# Patient Record
Sex: Female | Born: 1940
Health system: Southern US, Community
[De-identification: ages and names within clinical notes are randomized; demographics above are authoritative.]

## PROBLEM LIST (undated history)

## (undated) DIAGNOSIS — J449 Chronic obstructive pulmonary disease, unspecified: Secondary | ICD-10-CM

## (undated) DIAGNOSIS — F4310 Post-traumatic stress disorder, unspecified: Secondary | ICD-10-CM

## (undated) DIAGNOSIS — I10 Essential (primary) hypertension: Secondary | ICD-10-CM

## (undated) DIAGNOSIS — J45909 Unspecified asthma, uncomplicated: Secondary | ICD-10-CM

## (undated) DIAGNOSIS — E119 Type 2 diabetes mellitus without complications: Secondary | ICD-10-CM

## (undated) HISTORY — PX: ABDOMINAL HYSTERECTOMY: SHX81

## (undated) HISTORY — PX: APPENDECTOMY: SHX54

## (undated) HISTORY — PX: TONSILLECTOMY: SUR1361

## (undated) HISTORY — PX: TOTAL HIP ARTHROPLASTY: SHX124

## (undated) HISTORY — DX: Post-traumatic stress disorder, unspecified: F43.10

## (undated) HISTORY — DX: Essential (primary) hypertension: I10

## (undated) HISTORY — PX: BRAIN SURGERY: SHX531

---

## 2015-05-14 LAB — HM COLONOSCOPY

## 2015-11-06 LAB — HM MAMMOGRAPHY: HM Mammogram: NORMAL (ref 0–4)

## 2015-12-28 DIAGNOSIS — R4182 Altered mental status, unspecified: Secondary | ICD-10-CM | POA: Diagnosis not present

## 2015-12-28 DIAGNOSIS — T1491 Suicide attempt: Secondary | ICD-10-CM | POA: Diagnosis not present

## 2015-12-28 DIAGNOSIS — G319 Degenerative disease of nervous system, unspecified: Secondary | ICD-10-CM | POA: Diagnosis not present

## 2015-12-28 DIAGNOSIS — J449 Chronic obstructive pulmonary disease, unspecified: Secondary | ICD-10-CM | POA: Diagnosis not present

## 2015-12-28 DIAGNOSIS — F4323 Adjustment disorder with mixed anxiety and depressed mood: Secondary | ICD-10-CM | POA: Diagnosis not present

## 2015-12-28 DIAGNOSIS — Z87891 Personal history of nicotine dependence: Secondary | ICD-10-CM | POA: Diagnosis not present

## 2015-12-28 DIAGNOSIS — I2510 Atherosclerotic heart disease of native coronary artery without angina pectoris: Secondary | ICD-10-CM | POA: Diagnosis not present

## 2015-12-28 DIAGNOSIS — Z8673 Personal history of transient ischemic attack (TIA), and cerebral infarction without residual deficits: Secondary | ICD-10-CM | POA: Diagnosis not present

## 2015-12-28 DIAGNOSIS — I739 Peripheral vascular disease, unspecified: Secondary | ICD-10-CM | POA: Diagnosis not present

## 2015-12-28 DIAGNOSIS — E785 Hyperlipidemia, unspecified: Secondary | ICD-10-CM | POA: Diagnosis not present

## 2015-12-28 DIAGNOSIS — I10 Essential (primary) hypertension: Secondary | ICD-10-CM | POA: Diagnosis not present

## 2015-12-28 DIAGNOSIS — E119 Type 2 diabetes mellitus without complications: Secondary | ICD-10-CM | POA: Diagnosis not present

## 2015-12-28 DIAGNOSIS — T50904A Poisoning by unspecified drugs, medicaments and biological substances, undetermined, initial encounter: Secondary | ICD-10-CM | POA: Diagnosis not present

## 2015-12-28 DIAGNOSIS — Z780 Asymptomatic menopausal state: Secondary | ICD-10-CM | POA: Diagnosis not present

## 2015-12-28 DIAGNOSIS — T50902A Poisoning by unspecified drugs, medicaments and biological substances, intentional self-harm, initial encounter: Secondary | ICD-10-CM | POA: Diagnosis not present

## 2015-12-28 DIAGNOSIS — G9389 Other specified disorders of brain: Secondary | ICD-10-CM | POA: Diagnosis not present

## 2015-12-28 DIAGNOSIS — T428X2A Poisoning by antiparkinsonism drugs and other central muscle-tone depressants, intentional self-harm, initial encounter: Secondary | ICD-10-CM | POA: Diagnosis not present

## 2015-12-29 DIAGNOSIS — F4323 Adjustment disorder with mixed anxiety and depressed mood: Secondary | ICD-10-CM | POA: Diagnosis not present

## 2016-02-13 DIAGNOSIS — H903 Sensorineural hearing loss, bilateral: Secondary | ICD-10-CM | POA: Diagnosis not present

## 2016-03-03 DIAGNOSIS — H52221 Regular astigmatism, right eye: Secondary | ICD-10-CM | POA: Diagnosis not present

## 2016-03-03 DIAGNOSIS — Z961 Presence of intraocular lens: Secondary | ICD-10-CM | POA: Diagnosis not present

## 2016-03-03 DIAGNOSIS — H02831 Dermatochalasis of right upper eyelid: Secondary | ICD-10-CM | POA: Diagnosis not present

## 2016-03-03 DIAGNOSIS — H02834 Dermatochalasis of left upper eyelid: Secondary | ICD-10-CM | POA: Diagnosis not present

## 2016-03-03 DIAGNOSIS — H353211 Exudative age-related macular degeneration, right eye, with active choroidal neovascularization: Secondary | ICD-10-CM | POA: Diagnosis not present

## 2016-03-03 DIAGNOSIS — H5442 Blindness, left eye, normal vision right eye: Secondary | ICD-10-CM | POA: Diagnosis not present

## 2016-03-03 DIAGNOSIS — H353223 Exudative age-related macular degeneration, left eye, with inactive scar: Secondary | ICD-10-CM | POA: Diagnosis not present

## 2016-03-03 DIAGNOSIS — E1165 Type 2 diabetes mellitus with hyperglycemia: Secondary | ICD-10-CM | POA: Diagnosis not present

## 2016-03-03 DIAGNOSIS — H2512 Age-related nuclear cataract, left eye: Secondary | ICD-10-CM | POA: Diagnosis not present

## 2016-03-05 DIAGNOSIS — F329 Major depressive disorder, single episode, unspecified: Secondary | ICD-10-CM | POA: Diagnosis not present

## 2016-03-05 DIAGNOSIS — F4310 Post-traumatic stress disorder, unspecified: Secondary | ICD-10-CM | POA: Diagnosis not present

## 2016-03-05 DIAGNOSIS — F419 Anxiety disorder, unspecified: Secondary | ICD-10-CM | POA: Diagnosis not present

## 2016-03-05 DIAGNOSIS — I10 Essential (primary) hypertension: Secondary | ICD-10-CM | POA: Diagnosis not present

## 2016-03-16 DIAGNOSIS — F331 Major depressive disorder, recurrent, moderate: Secondary | ICD-10-CM | POA: Diagnosis not present

## 2016-04-01 DIAGNOSIS — F329 Major depressive disorder, single episode, unspecified: Secondary | ICD-10-CM | POA: Diagnosis not present

## 2016-07-05 DIAGNOSIS — I10 Essential (primary) hypertension: Secondary | ICD-10-CM | POA: Diagnosis not present

## 2016-07-05 DIAGNOSIS — F418 Other specified anxiety disorders: Secondary | ICD-10-CM | POA: Diagnosis not present

## 2016-07-05 DIAGNOSIS — S93402A Sprain of unspecified ligament of left ankle, initial encounter: Secondary | ICD-10-CM | POA: Diagnosis not present

## 2016-07-05 DIAGNOSIS — S92912A Unspecified fracture of left toe(s), initial encounter for closed fracture: Secondary | ICD-10-CM | POA: Diagnosis not present

## 2016-08-16 DIAGNOSIS — F329 Major depressive disorder, single episode, unspecified: Secondary | ICD-10-CM | POA: Diagnosis not present

## 2016-08-16 DIAGNOSIS — E1165 Type 2 diabetes mellitus with hyperglycemia: Secondary | ICD-10-CM | POA: Diagnosis not present

## 2016-08-16 DIAGNOSIS — G2581 Restless legs syndrome: Secondary | ICD-10-CM | POA: Diagnosis not present

## 2016-08-16 DIAGNOSIS — I10 Essential (primary) hypertension: Secondary | ICD-10-CM | POA: Diagnosis not present

## 2016-08-16 DIAGNOSIS — F3342 Major depressive disorder, recurrent, in full remission: Secondary | ICD-10-CM | POA: Diagnosis not present

## 2016-08-16 DIAGNOSIS — F419 Anxiety disorder, unspecified: Secondary | ICD-10-CM | POA: Diagnosis not present

## 2016-08-16 DIAGNOSIS — F4310 Post-traumatic stress disorder, unspecified: Secondary | ICD-10-CM | POA: Diagnosis not present

## 2016-08-24 DIAGNOSIS — H353211 Exudative age-related macular degeneration, right eye, with active choroidal neovascularization: Secondary | ICD-10-CM | POA: Diagnosis not present

## 2016-08-24 DIAGNOSIS — H353223 Exudative age-related macular degeneration, left eye, with inactive scar: Secondary | ICD-10-CM | POA: Diagnosis not present

## 2016-09-11 DIAGNOSIS — Z23 Encounter for immunization: Secondary | ICD-10-CM | POA: Diagnosis not present

## 2016-10-20 DIAGNOSIS — E1165 Type 2 diabetes mellitus with hyperglycemia: Secondary | ICD-10-CM | POA: Diagnosis not present

## 2016-10-20 DIAGNOSIS — H353211 Exudative age-related macular degeneration, right eye, with active choroidal neovascularization: Secondary | ICD-10-CM | POA: Diagnosis not present

## 2016-10-20 DIAGNOSIS — H353223 Exudative age-related macular degeneration, left eye, with inactive scar: Secondary | ICD-10-CM | POA: Diagnosis not present

## 2016-10-20 DIAGNOSIS — H43821 Vitreomacular adhesion, right eye: Secondary | ICD-10-CM | POA: Diagnosis not present

## 2016-10-20 DIAGNOSIS — H5440 Blindness, one eye, unspecified eye: Secondary | ICD-10-CM | POA: Diagnosis not present

## 2016-11-02 DIAGNOSIS — Z87891 Personal history of nicotine dependence: Secondary | ICD-10-CM | POA: Diagnosis not present

## 2016-11-02 DIAGNOSIS — Z8673 Personal history of transient ischemic attack (TIA), and cerebral infarction without residual deficits: Secondary | ICD-10-CM | POA: Diagnosis not present

## 2016-11-02 DIAGNOSIS — E785 Hyperlipidemia, unspecified: Secondary | ICD-10-CM | POA: Diagnosis not present

## 2016-11-02 DIAGNOSIS — M25512 Pain in left shoulder: Secondary | ICD-10-CM | POA: Diagnosis not present

## 2016-11-02 DIAGNOSIS — I517 Cardiomegaly: Secondary | ICD-10-CM | POA: Diagnosis not present

## 2016-11-02 DIAGNOSIS — G8929 Other chronic pain: Secondary | ICD-10-CM | POA: Diagnosis not present

## 2016-11-02 DIAGNOSIS — R079 Chest pain, unspecified: Secondary | ICD-10-CM | POA: Diagnosis not present

## 2016-11-02 DIAGNOSIS — E1151 Type 2 diabetes mellitus with diabetic peripheral angiopathy without gangrene: Secondary | ICD-10-CM | POA: Diagnosis not present

## 2016-11-02 DIAGNOSIS — I2510 Atherosclerotic heart disease of native coronary artery without angina pectoris: Secondary | ICD-10-CM | POA: Diagnosis not present

## 2016-11-02 DIAGNOSIS — Z780 Asymptomatic menopausal state: Secondary | ICD-10-CM | POA: Diagnosis not present

## 2016-11-02 DIAGNOSIS — I10 Essential (primary) hypertension: Secondary | ICD-10-CM | POA: Diagnosis not present

## 2016-11-02 DIAGNOSIS — J449 Chronic obstructive pulmonary disease, unspecified: Secondary | ICD-10-CM | POA: Diagnosis not present

## 2016-11-02 DIAGNOSIS — I714 Abdominal aortic aneurysm, without rupture: Secondary | ICD-10-CM | POA: Diagnosis not present

## 2016-11-04 ENCOUNTER — Emergency Department (HOSPITAL_BASED_OUTPATIENT_CLINIC_OR_DEPARTMENT_OTHER)
Admission: EM | Admit: 2016-11-04 | Discharge: 2016-11-04 | Disposition: A | Discharge status: Home / Self Care | Payer: Medicare Other | Attending: Emergency Medicine | Admitting: Emergency Medicine

## 2016-11-04 ENCOUNTER — Emergency Department (HOSPITAL_BASED_OUTPATIENT_CLINIC_OR_DEPARTMENT_OTHER): Payer: Medicare Other

## 2016-11-04 ENCOUNTER — Encounter (HOSPITAL_BASED_OUTPATIENT_CLINIC_OR_DEPARTMENT_OTHER): Payer: Self-pay | Admitting: *Deleted

## 2016-11-04 DIAGNOSIS — J45909 Unspecified asthma, uncomplicated: Secondary | ICD-10-CM | POA: Insufficient documentation

## 2016-11-04 DIAGNOSIS — Z87891 Personal history of nicotine dependence: Secondary | ICD-10-CM | POA: Insufficient documentation

## 2016-11-04 DIAGNOSIS — J449 Chronic obstructive pulmonary disease, unspecified: Secondary | ICD-10-CM | POA: Diagnosis not present

## 2016-11-04 DIAGNOSIS — J189 Pneumonia, unspecified organism: Secondary | ICD-10-CM | POA: Diagnosis not present

## 2016-11-04 DIAGNOSIS — I10 Essential (primary) hypertension: Secondary | ICD-10-CM | POA: Diagnosis present

## 2016-11-04 DIAGNOSIS — E119 Type 2 diabetes mellitus without complications: Secondary | ICD-10-CM | POA: Diagnosis not present

## 2016-11-04 DIAGNOSIS — R0789 Other chest pain: Secondary | ICD-10-CM | POA: Diagnosis not present

## 2016-11-04 DIAGNOSIS — R0602 Shortness of breath: Secondary | ICD-10-CM | POA: Diagnosis not present

## 2016-11-04 DIAGNOSIS — R079 Chest pain, unspecified: Secondary | ICD-10-CM | POA: Diagnosis not present

## 2016-11-04 HISTORY — DX: Essential (primary) hypertension: I10

## 2016-11-04 HISTORY — DX: Chronic obstructive pulmonary disease, unspecified: J44.9

## 2016-11-04 HISTORY — DX: Type 2 diabetes mellitus without complications: E11.9

## 2016-11-04 HISTORY — DX: Unspecified asthma, uncomplicated: J45.909

## 2016-11-04 LAB — CBC WITH DIFFERENTIAL/PLATELET
BASOS PCT: 0 %
Basophils Absolute: 0 10*3/uL (ref 0.0–0.1)
EOS ABS: 0.6 10*3/uL (ref 0.0–0.7)
Eosinophils Relative: 8 %
HEMATOCRIT: 34.6 % — AB (ref 36.0–46.0)
HEMOGLOBIN: 10.8 g/dL — AB (ref 12.0–15.0)
LYMPHS ABS: 1.8 10*3/uL (ref 0.7–4.0)
Lymphocytes Relative: 24 %
MCH: 27.4 pg (ref 26.0–34.0)
MCHC: 31.2 g/dL (ref 30.0–36.0)
MCV: 87.8 fL (ref 78.0–100.0)
MONOS PCT: 7 %
Monocytes Absolute: 0.5 10*3/uL (ref 0.1–1.0)
NEUTROS ABS: 4.7 10*3/uL (ref 1.7–7.7)
NEUTROS PCT: 61 %
Platelets: 158 10*3/uL (ref 150–400)
RBC: 3.94 MIL/uL (ref 3.87–5.11)
RDW: 15 % (ref 11.5–15.5)
WBC: 7.6 10*3/uL (ref 4.0–10.5)

## 2016-11-04 LAB — COMPREHENSIVE METABOLIC PANEL
ALBUMIN: 4 g/dL (ref 3.5–5.0)
ALK PHOS: 78 U/L (ref 38–126)
ALT: 9 U/L — AB (ref 14–54)
AST: 12 U/L — ABNORMAL LOW (ref 15–41)
Anion gap: 8 (ref 5–15)
BILIRUBIN TOTAL: 0.6 mg/dL (ref 0.3–1.2)
BUN: 28 mg/dL — ABNORMAL HIGH (ref 6–20)
CALCIUM: 9.5 mg/dL (ref 8.9–10.3)
CO2: 23 mmol/L (ref 22–32)
CREATININE: 1.13 mg/dL — AB (ref 0.44–1.00)
Chloride: 108 mmol/L (ref 101–111)
GFR calc Af Amer: 54 mL/min — ABNORMAL LOW (ref >60)
GFR calc non Af Amer: 47 mL/min — ABNORMAL LOW (ref >60)
GLUCOSE: 105 mg/dL — AB (ref 65–99)
Potassium: 4.2 mmol/L (ref 3.5–5.1)
SODIUM: 139 mmol/L (ref 135–145)
TOTAL PROTEIN: 6.8 g/dL (ref 6.5–8.1)

## 2016-11-04 LAB — TROPONIN I: Troponin I: <0.03 ng/mL (ref <0.03)

## 2016-11-04 LAB — LIPASE, BLOOD: Lipase: 37 U/L (ref 11–51)

## 2016-11-04 MED ORDER — DOXYCYCLINE HYCLATE 100 MG PO CAPS: 100.0000 mg | ORAL_CAPSULE | Freq: Two times a day (BID) | ORAL | 0 refills | Status: DC

## 2016-11-04 NOTE — ED Notes (Signed)
Pt states that normally she is only on O2 at night, but earlier today she was ShOB. Pt placed on R/A and is maintaining SpO2 at 93-95% while in bed.

## 2016-11-04 NOTE — Discharge Instructions (Signed)
Take antibiotic as prescribed until all gone for possible pneumonia that was seen on xray. Make sure to drink plenty of fluids. Follow up with a family doctor. Return if worsening symptoms.

## 2016-11-04 NOTE — ED Triage Notes (Signed)
She was in the ED at [facility] yesterday and they kept her over night for HTN. She was discharged this am. She called [facility] this evening to let them know her BP at home was low and she was told to come here. Tightness in her left lower leg x 2 days. Her left hand has been numb for a few days. She is on home oxygen. She drove herself here.

## 2016-11-04 NOTE — ED Provider Notes (Signed)
[name] DEPT [facility] Provider Note   CSN: [id number] Arrival date & time: 11/04/16  1548  By signing my name below, I, Dawn Stewart, attest that this documentation has been prepared under the direction and in the presence of Dawn Clinkscale, PA-C  . Electronically Signed: Judithann Stewart, ED Scribe. 11/04/16. 4:30 PM.   History   Chief Complaint Chief Complaint  Patient presents with  . Hypertension    HPI Comments: Dawn Stewart is a 75 y.o. female with a hx of DM, hypertension, and COPD who presents to the Emergency Department requesting evaluation for hypotension. She explains that she was evaluated in the ED yesterday for hypertension but was discharged when her BP became normal while waiting. However when she checked her BP today, it was 156/56 and when she informed [facility], they advised her to be evaluated in the ED since that reading was low. She reports that she does not currently feel well experiencing intermittent episodes of left chest pain that radiates to her left back, intermittent sensation that her left leg feels as though "there is a rubber band on it", and left hand numbness. She adds that she has SOB, stating that she normally uses her home O2 at night but has needed to use it all day today. No medications PTA noted. Pt has an allergy to IVP dye. She denies any fever, chills, nausea, vomiting, or any other symptoms.   The history is provided by the patient. No language interpreter was used.    Past Medical History:  Diagnosis Date  . Asthma   . COPD (chronic obstructive pulmonary disease) ([place])   . Diabetes mellitus without complication ([place])   . Hypertension     There are no active problems to display for this patient.   Past Surgical History:  Procedure Laterality Date  . ABDOMINAL HYSTERECTOMY    . APPENDECTOMY    . BRAIN SURGERY    . TONSILLECTOMY    . TOTAL HIP ARTHROPLASTY      OB History    No data available       Home  Medications    Prior to Admission medications   Not on File    Family History No family history on file.  Social History Social History  Substance Use Topics  . Smoking status: Former Research scientist (life sciences)  . Smokeless tobacco: Never Used  . Alcohol use No     Allergies   Ivp dye [iodinated diagnostic agents]   Review of Systems Review of Systems  Constitutional: Negative for chills and fever.  Cardiovascular: Positive for chest pain.  Gastrointestinal: Negative for nausea and vomiting.  Musculoskeletal: Positive for arthralgias.  Neurological: Positive for weakness and numbness.  All other systems reviewed and are negative.    Physical Exam Updated Vital Signs BP (!) 145/43   Pulse 62   Temp 98 F (36.7 C) (Oral)   Resp 20   Ht 5\' 3"  (1.6 m)   Wt 136 lb (61.7 kg)   SpO2 94%   BMI 24.09 kg/m   Physical Exam  Constitutional: She is oriented to person, place, and time. She appears well-developed and well-nourished. No distress.  HENT:  Head: Normocephalic and atraumatic.  Eyes: Conjunctivae and EOM are normal.  Neck: Neck supple. No tracheal deviation present.  Cardiovascular: Normal rate, regular rhythm and normal heart sounds.   Pulmonary/Chest: Effort normal. No respiratory distress. She has no wheezes. She has no rales.  Musculoskeletal: Normal range of motion. She exhibits no edema.  Neurological: She is  alert and oriented to person, place, and time.  Skin: Skin is warm and dry.  Psychiatric: She has a normal mood and affect. Her behavior is normal.  Nursing note and vitals reviewed.    ED Treatments / Results  DIAGNOSTIC STUDIES: Oxygen Saturation is 94% on RA, adequate by my interpretation.    COORDINATION OF CARE: 4:27 PM- Pt advised of plan for treatment and pt agrees. Pt will receive lab work and EKG for further evaluation.    Labs (all labs ordered are listed, but only abnormal results are displayed) Labs Reviewed  CBC WITH DIFFERENTIAL/PLATELET -  Abnormal; Notable for the following:       Result Value   Hemoglobin 10.8 (*)    HCT 34.6 (*)    All other components within normal limits  COMPREHENSIVE METABOLIC PANEL - Abnormal; Notable for the following:    Glucose, Bld 105 (*)    BUN 28 (*)    Creatinine, Ser 1.13 (*)    AST 12 (*)    ALT 9 (*)    GFR calc non Af Amer 47 (*)    GFR calc Af Amer 54 (*)    All other components within normal limits  LIPASE, BLOOD  TROPONIN I    EKG  EKG Interpretation  Date/Time:  Thursday November 04 2016 17:00:54 EST Ventricular Rate:  50 PR Interval:    QRS Duration: 88 QT Interval:  463 QTC Calculation: 423 R Axis:   36 Text Interpretation:  Sinus rhythm Baseline wander in lead(s) V2 No old tracing to compare Confirmed by [facility] MD, PEDRO [phone]) on 11/04/2016 5:20:08 PM       Radiology Dg Chest 2 View  Result Date: 11/04/2016 CLINICAL DATA:  Chest pain.  Hypertension.  Shortness of breath. EXAM: CHEST  2 VIEW COMPARISON:  None. FINDINGS: Heart size is normal. There is aortic atherosclerosis. The left lung is clear. I think there is mild bronchopneumonia in the right lower lobe with air bronchograms. No effusions. No bone abnormality. IMPRESSION: Suspicion of right lower lobe pneumonia with air bronchograms. Electronically Signed   By: Nelson Chimes M.D.   On: 11/04/2016 16:48    Procedures Procedures (including critical care time)  Medications Ordered in ED Medications - No data to display   Initial Impression / Assessment and Plan / ED Course  Dawn Senior, PA-C has reviewed the triage vital signs and the nursing notes.  Pertinent labs & imaging results that were available during my care of the patient were reviewed by me and considered in my medical decision making (see chart for details).  Clinical Course   Patient in emergency department with multiple complaints, states she is not feeling well, having left-sided chest pains which she attributes to muscle spasms,  also complaining of left knee pain, intermittent tingling and numbness in left hand. She states she's not feeling well and has had to use her oxygen during the day today when she normally uses it at night only. She apparently was seen at [facility] last night and had blood work done which was unremarkable and she was discharged home. She is here for reevaluation. Will recheck labs. Patient states that she has had multiple stents in her heart, states this was done out of town and currently does not have a PCP and cardiologist. Will get EKG and chest x-ray.    6:29 PM Negative troponin. Compared to the blood work last night, slightly elevated creatinine, otherwise no significant blood work abnormalities. X-ray showing  possible pneumonia, patient denies any increased and cough or sputum production, however states that she does feel like she needs more oxygen and has been using her oxygen during the day. I will put her on doxycycline for possible early pneumonia. I will have her follow-up with the family doctor closely. I do not think she needs any further imaging, admission, vital signs are normal, she is stable for discharge home. Return precautions discussed.  Vitals:   11/04/16 1556 11/04/16 1730 11/04/16 1800 11/04/16 1814  BP: (!) 145/43 (!) 146/37 (!) 153/41   Pulse: 62 (!) 52    Resp: 20 11 19    Temp: 98 F (36.7 C)     TempSrc: Oral     SpO2: 94% 100%  95%  Weight:      Height:         Final Clinical Impressions(s) / ED Diagnoses   Final diagnoses:  Chest pain, unspecified type  Community acquired pneumonia of right lung, unspecified part of lung    New Prescriptions New Prescriptions   DOXYCYCLINE (VIBRAMYCIN) 100 MG CAPSULE    Take 1 capsule (100 mg total) by mouth 2 (two) times daily.    I personally performed the services described in this documentation, which was scribed in my presence. The recorded information has been reviewed and is accurate.    Dawn Senior, PA-C 11/04/16 [place], MD 11/05/16 [phone]

## 2016-11-04 NOTE — ED Notes (Signed)
Pt stating that she needs a cardiologist, because it's time for a check up because she has stents in her heart. Pt does not have a cardiologist since she moved from [place], [place] about a year ago.

## 2016-11-06 DIAGNOSIS — M25512 Pain in left shoulder: Secondary | ICD-10-CM | POA: Diagnosis not present

## 2016-11-06 DIAGNOSIS — I10 Essential (primary) hypertension: Secondary | ICD-10-CM | POA: Diagnosis not present

## 2016-12-03 ENCOUNTER — Encounter: Payer: Self-pay | Admitting: Behavioral Health

## 2016-12-03 ENCOUNTER — Telehealth: Payer: Self-pay | Admitting: Behavioral Health

## 2016-12-03 NOTE — Telephone Encounter (Signed)
Pre-Visit Call completed with patient and chart updated.   Pre-Visit Info documented in Specialty Comments under [facility].    

## 2016-12-07 ENCOUNTER — Telehealth: Payer: Self-pay | Admitting: General Practice

## 2016-12-07 ENCOUNTER — Ambulatory Visit: Payer: Medicare Other | Admitting: Medical

## 2016-12-07 NOTE — Telephone Encounter (Signed)
Pt lvm at 8:19a to cancel her appt. Pt says that her windshield is frozen and is unable to see to get to her appt.

## 2016-12-14 DIAGNOSIS — H353223 Exudative age-related macular degeneration, left eye, with inactive scar: Secondary | ICD-10-CM | POA: Diagnosis not present

## 2016-12-14 DIAGNOSIS — H43821 Vitreomacular adhesion, right eye: Secondary | ICD-10-CM | POA: Diagnosis not present

## 2016-12-14 DIAGNOSIS — H353211 Exudative age-related macular degeneration, right eye, with active choroidal neovascularization: Secondary | ICD-10-CM | POA: Diagnosis not present

## 2016-12-17 ENCOUNTER — Ambulatory Visit (INDEPENDENT_AMBULATORY_CARE_PROVIDER_SITE_OTHER): Payer: Medicare Other | Admitting: Family Medicine

## 2016-12-17 DIAGNOSIS — Z0289 Encounter for other administrative examinations: Secondary | ICD-10-CM

## 2016-12-22 ENCOUNTER — Ambulatory Visit: Payer: Medicare Other | Admitting: Family Medicine

## 2016-12-24 ENCOUNTER — Telehealth: Payer: Self-pay | Admitting: Family Medicine

## 2016-12-24 ENCOUNTER — Ambulatory Visit: Payer: Medicare Other | Admitting: Family Medicine

## 2016-12-24 NOTE — Telephone Encounter (Signed)
Pt no-showed for me twice and Edward once. Please see to it that she is not scheduled with our office again. TY.

## 2016-12-29 ENCOUNTER — Ambulatory Visit (INDEPENDENT_AMBULATORY_CARE_PROVIDER_SITE_OTHER): Payer: Medicare Other | Admitting: Family Medicine

## 2016-12-29 ENCOUNTER — Encounter: Payer: Self-pay | Admitting: Family Medicine

## 2016-12-29 VITALS — BP 138/58 | HR 79 | Temp 97.2°F | Resp 17 | Ht 63.5 in | Wt 133.4 lb

## 2016-12-29 DIAGNOSIS — Z1239 Encounter for other screening for malignant neoplasm of breast: Secondary | ICD-10-CM

## 2016-12-29 DIAGNOSIS — J4520 Mild intermittent asthma, uncomplicated: Secondary | ICD-10-CM | POA: Diagnosis not present

## 2016-12-29 DIAGNOSIS — M25512 Pain in left shoulder: Secondary | ICD-10-CM | POA: Diagnosis not present

## 2016-12-29 DIAGNOSIS — I739 Peripheral vascular disease, unspecified: Secondary | ICD-10-CM | POA: Diagnosis not present

## 2016-12-29 DIAGNOSIS — E119 Type 2 diabetes mellitus without complications: Secondary | ICD-10-CM | POA: Diagnosis not present

## 2016-12-29 DIAGNOSIS — F4310 Post-traumatic stress disorder, unspecified: Secondary | ICD-10-CM

## 2016-12-29 MED ORDER — CLOPIDOGREL BISULFATE 75 MG PO TABS: 75.0000 mg | ORAL_TABLET | Freq: Every day | ORAL | 0 refills | Status: DC

## 2016-12-29 MED ORDER — SITAGLIPTIN PHOS-METFORMIN HCL 50-1000 MG PO TABS: 1.0000 | ORAL_TABLET | Freq: Two times a day (BID) | ORAL | 1 refills | Status: DC

## 2016-12-29 MED ORDER — CARBIDOPA-LEVODOPA 25-100 MG PO TABS: 1.0000 | ORAL_TABLET | Freq: Every day | ORAL | 0 refills | Status: DC

## 2016-12-29 MED ORDER — ALBUTEROL SULFATE HFA 108 (90 BASE) MCG/ACT IN AERS: 2.0000 | INHALATION_SPRAY | Freq: Four times a day (QID) | RESPIRATORY_TRACT | 0 refills | Status: DC | PRN

## 2016-12-29 MED ORDER — SITAGLIPTIN PHOS-METFORMIN HCL 50-1000 MG PO TABS: 1.0000 | ORAL_TABLET | Freq: Two times a day (BID) | ORAL | 0 refills | Status: DC

## 2016-12-29 MED ORDER — VALSARTAN-HYDROCHLOROTHIAZIDE 320-12.5 MG PO TABS: 1.0000 | ORAL_TABLET | Freq: Every day | ORAL | 0 refills | Status: DC

## 2016-12-29 MED ORDER — SERTRALINE HCL 100 MG PO TABS: 100.0000 mg | ORAL_TABLET | Freq: Every day | ORAL | 1 refills | Status: DC

## 2016-12-29 MED ORDER — METOPROLOL TARTRATE 25 MG PO TABS: 25.0000 mg | ORAL_TABLET | Freq: Two times a day (BID) | ORAL | 1 refills | Status: DC

## 2016-12-29 MED ORDER — ATORVASTATIN CALCIUM 40 MG PO TABS: 40.0000 mg | ORAL_TABLET | Freq: Every day | ORAL | 3 refills | Status: DC

## 2016-12-29 MED ORDER — VALSARTAN-HYDROCHLOROTHIAZIDE 320-12.5 MG PO TABS: 1.0000 | ORAL_TABLET | Freq: Every day | ORAL | 1 refills | Status: DC

## 2016-12-29 MED ORDER — METOPROLOL TARTRATE 25 MG PO TABS: 25.0000 mg | ORAL_TABLET | Freq: Two times a day (BID) | ORAL | 0 refills | Status: DC

## 2016-12-29 MED ORDER — ATORVASTATIN CALCIUM 40 MG PO TABS: 40.0000 mg | ORAL_TABLET | Freq: Every day | ORAL | 0 refills | Status: DC

## 2016-12-29 MED ORDER — SERTRALINE HCL 100 MG PO TABS: 100.0000 mg | ORAL_TABLET | Freq: Every day | ORAL | 0 refills | Status: DC

## 2016-12-29 NOTE — Patient Instructions (Addendum)
Call Dr. Annice Needy office, your vascular surgeon, at [phone] and ask about imaging for your neck and legs.   Someone will contact you regarding your mammogram.  EXERCISES  RANGE OF MOTION (ROM) AND STRETCHING EXERCISES These exercises may help you when beginning to rehabilitate your injury. Your symptoms may resolve with or without further involvement from your physician, physical therapist or athletic trainer. While completing these exercises, remember:   Restoring tissue flexibility helps normal motion to return to the joints. This allows healthier, less painful movement and activity.  An effective stretch should be held for at least 30 seconds.  A stretch should never be painful. You should only feel a gentle lengthening or release in the stretched tissue.  ROM - Pendulum  Bend at the waist so that your right / left arm falls away from your body. Support yourself with your opposite hand on a solid surface, such as a table or a countertop.  Your right / left arm should be perpendicular to the ground. If it is not perpendicular, you need to lean over farther. Relax the muscles in your right / left arm and shoulder as much as possible.  Gently sway your hips and trunk so they move your right / left arm without any use of your right / left shoulder muscles.  Progress your movements so that your right / left arm moves side to side, then forward and backward, and finally, both clockwise and counterclockwise.  Complete 1-2 repetitions in each direction. Many people use this exercise to relieve discomfort in their shoulder as well as to gain range of motion. Repeat 2-3 times. Complete this exercise 1 times per day.  STRETCH - Flexion, Standing  Stand with good posture. With an underhand grip on your right / left hand and an overhand grip on the opposite hand, grasp a broomstick or cane so that your hands are a little more than shoulder-width apart.  Keeping your right / left elbow  straight and shoulder muscles relaxed, push the stick with your opposite hand to raise your right / left arm in front of your body and then overhead. Raise your arm until you feel a stretch in your right / left shoulder, but before you have increased shoulder pain.  Try to avoid shrugging your right / left shoulder as your arm rises by keeping your shoulder blade tucked down and toward your mid-back spine. Hold 15-20 seconds.  Slowly return to the starting position. Repeat 2-3 times. Complete this exercise 1time per day.    STRETCH - External Rotation and Abduction  Stagger your stance through a doorframe. It does not matter which foot is forward.  As instructed by your physician, physical therapist or athletic trainer, place your hands: ? And forearms above your head and on the door frame. ? And forearms at head-height and on the door frame. ? At elbow-height and on the door frame.  Keeping your head and chest upright and your stomach muscles tight to prevent over-extending your low-back, slowly shift your weight onto your front foot until you feel a stretch across your chest and/or in the front of your shoulders.  Hold 15-20 seconds. Shift your weight to your back foot to release the stretch. Repeat 2-3 times. Complete this stretch 1 times per day.   STRENGTHENING EXERCISES  These exercises may help you when beginning to rehabilitate your injury. They may resolve your symptoms with or without further involvement from your physician, physical therapist or athletic trainer. While completing these exercises,  remember:   Muscles can gain both the endurance and the strength needed for everyday activities through controlled exercises.  Complete these exercises as instructed by your physician, physical therapist or athletic trainer. Progress the resistance and repetitions only as guided.  You may experience muscle soreness or fatigue, but the pain or discomfort you are trying to eliminate  should never worsen during these exercises. If this pain does worsen, stop and make certain you are following the directions exactly. If the pain is still present after adjustments, discontinue the exercise until you can discuss the trouble with your clinician.  If advised by your physician, during your recovery, avoid activity or exercises which involve actions that place your right / left hand or elbow above your head or behind your back or head. These positions stress the tissues which are trying to heal.  STRENGTH - Scapular Depression and Adduction  With good posture, sit on a firm chair. Supported your arms in front of you with pillows, arm rests or a table top. Have your elbows in line with the sides of your body.  Gently draw your shoulder blades down and toward your mid-back spine. Gradually increase the tension without tensing the muscles along the top of your shoulders and the back of your neck.  Hold for 15-20 seconds. Slowly release the tension and relax your muscles completely before completing the next repetition.  After you have practiced this exercise, remove the arm support and complete it in standing as well as sitting. Repeat 2-3 times. Complete this exercise 1 time per day.   STRENGTH - External Rotators  Secure a rubber exercise band/tubing to a fixed object so that it is at the same height as your right / left elbow when you are standing or sitting on a firm surface.  Stand or sit so that the secured exercise band/tubing is at your side that is not injured.  Bend your elbow 90 degrees. Place a folded towel or small pillow under your right / left arm so that your elbow is a few inches away from your side.  Keeping the tension on the exercise band/tubing, pull it away from your body, as if pivoting on your elbow. Be sure to keep your body steady so that the movement is only coming from your shoulder rotating.  Hold 15-20 seconds. Release the tension in a controlled  manner as you return to the starting position. Repeat 2-3 times. Complete this exercise 1 time per day.    Document Released: 10/06/2005 Document Revised: 12/13/2014 Document Reviewed: 03/06/2009 Elsevier Interactive Patient Education [employer].

## 2016-12-29 NOTE — Progress Notes (Signed)
Pre visit review using our clinic review tool, if applicable. No additional management support is needed unless otherwise documented below in the visit note. 

## 2016-12-29 NOTE — Progress Notes (Signed)
Chief Complaint  Patient presents with  . New Patient (Initial Visit)       New Patient Visit SUBJECTIVE: HPI: Dawn Stewart is an 76 y.o.female who is being seen for establishing care.  The patient was previously seen at [facility].  L shoulder pain Pt got flu shot in 10/2016 and has been having pain and decreased ROM ever since. No other injury or change in activity noticed. She has not been using anything for pain. Denies any numbness or tingling, feels like she is not as strong.   She also has hx of carotid vessel disease and peripheral vascular disease. She follows with Dr. Maryjean Morn of vascular surgery and is due for follow up imaging.  Allergies  Allergen Reactions  . Ivp Dye [Iodinated Diagnostic Agents]     unknown  . Tape Other (See Comments)    Pt. reports that it gives her blisters.    Past Medical History:  Diagnosis Date  . Asthma   . COPD (chronic obstructive pulmonary disease) ([place])   . Diabetes mellitus without complication ([place])   . Hypertension   . Post traumatic stress disorder (PTSD)    Past Surgical History:  Procedure Laterality Date  . ABDOMINAL HYSTERECTOMY    . APPENDECTOMY    . BRAIN SURGERY    . TONSILLECTOMY    . TOTAL HIP ARTHROPLASTY     Social History   Social History  . Marital status: Widowed   Social History Main Topics  . Smoking status: Former Research scientist (life sciences)  . Smokeless tobacco: Never Used  . Alcohol use No   Family History  Problem Relation Age of Onset  . Heart disease Mother   . Heart disease Father      Current Outpatient Prescriptions:  .  atorvastatin (LIPITOR) 40 MG tablet, Take 1 tablet (40 mg total) by mouth daily., Disp: 90 tablet, Rfl: 3 .  calcium carbonate (OS-CAL) 600 MG TABS tablet, Take 600 mg by mouth 2 (two) times daily with a meal., Disp: , Rfl:  .  carbidopa-levodopa (SINEMET IR) 25-100 MG tablet, Take 1 tablet by mouth at bedtime., Disp: 30 tablet, Rfl: 0 .  Cholecalciferol (VITAMIN D3) 2000  units TABS, Take by mouth daily., Disp: , Rfl:  .  clopidogrel (PLAVIX) 75 MG tablet, Take 1 tablet (75 mg total) by mouth daily., Disp: 30 tablet, Rfl: 0 .  fexofenadine (ALLEGRA) 180 MG tablet, Take 180 mg by mouth daily., Disp: , Rfl:  .  metoprolol tartrate (LOPRESSOR) 25 MG tablet, Take 1 tablet (25 mg total) by mouth 2 (two) times daily., Disp: 180 tablet, Rfl: 1 .  sertraline (ZOLOFT) 100 MG tablet, Take 1 tablet (100 mg total) by mouth daily., Disp: 90 tablet, Rfl: 1 .  sitaGLIPtin-metformin (JANUMET) 50-1000 MG tablet, Take 1 tablet by mouth 2 (two) times daily with a meal., Disp: 180 tablet, Rfl: 1 .  valsartan-hydrochlorothiazide (DIOVAN-HCT) 320-12.5 MG tablet, Take 1 tablet by mouth daily., Disp: 90 tablet, Rfl: 1 .  albuterol (PROVENTIL HFA;VENTOLIN HFA) 108 (90 Base) MCG/ACT inhaler, Inhale 2 puffs into the lungs every 6 (six) hours as needed for wheezing or shortness of breath., Disp: 1 Inhaler, Rfl: 0  No LMP recorded. Patient is postmenopausal.  ROS MSK: +L shoulder pain  Neuro: Denies numbness or tingling   OBJECTIVE: BP (!) 138/58 (BP Location: Right Arm, Patient Position: Sitting, Cuff Size: Normal)   Pulse 79   Temp 97.2 F (36.2 C) (Oral)   Resp 17   Ht 5'  3.5" (1.613 m)   Wt 133 lb 6.4 oz (60.5 kg)   SpO2 98%   BMI 23.26 kg/m   Constitutional: -  VS reviewed -  Well developed, well nourished, appears stated age -  No apparent distress  Psychiatric: -  Oriented to person, place, and time -  Memory intact -  Affect and mood normal -  Fluent conversation, good eye contact -  Judgment and insight age appropriate  Eye: -  Conjunctivae clear, no discharge -  Pupils symmetric, round, reactive to light  ENMT: -  Ears are patent b/l without erythema or discharge. TM's are shiny and clear b/l without evidence of effusion or infection. -  Oral mucosa without lesions, tongue and uvula midline    Tonsils not enlarged, no erythema, no exudate, trachea midline     Pharynx moist, no lesions, no erythema  Neck: -  No gross swelling, no palpable masses -  Thyroid midline, not enlarged, mobile, no palpable masses  Cardiovascular: -  RRR, no murmurs -  No LE edema +B/l carotid bruits, louder on the L  Respiratory: -  Normal respiratory effort, no accessory muscle use, no retraction -  Breath sounds equal, no wheezes, no ronchi, no crackles  Gastrointestinal: -  Bowel sounds normal -  No tenderness, no distention, no guarding, no masses  Neurological:  -  CN II - XII grossly intact -  Sensation grossly intact to light touch, equal bilaterally  Musculoskeletal: -  No clubbing, no cyanosis -  L shoulder- decreased abduction, internal rotation, strength throughout; neg cross over and cross over Obrien's, Obrien's, lift off, Speed's, +empty can, Neer's, and Hawkins  Skin: -  No significant lesion on inspection -  Warm and dry to palpation   ASSESSMENT/PLAN: Left shoulder pain, unspecified chronicity  Screening for breast cancer - Plan: MM DIGITAL SCREENING BILATERAL  PAD (peripheral artery disease) (HCC)  Diabetes mellitus type 2 in nonobese (HCC)  PTSD (post-traumatic stress disorder)  Mild intermittent asthma without complication - Plan: albuterol (PROVENTIL HFA;VENTOLIN HFA) 108 (90 Base) MCG/ACT inhaler  Patient instructed to sign release of records form from her previous PCP. Sounds like she is getting adhesive capsulitis after a painful flu shot. Home stretches and exercises given. Will reassess in 4 weeks and consider PT vs injections vs OMT. 30 days to [facility] and 90 days to mail service. Needs to call vascular surgery for refill on Plavix if needed beyond 30 days and follow up imaging. Patient should return in 1 mo for DM visit and labs. The patient voiced understanding and agreement to the plan.   [place], DO 12/29/16  3:36 PM

## 2017-01-04 ENCOUNTER — Ambulatory Visit (HOSPITAL_BASED_OUTPATIENT_CLINIC_OR_DEPARTMENT_OTHER): Payer: Medicare Other

## 2017-01-17 DIAGNOSIS — I6523 Occlusion and stenosis of bilateral carotid arteries: Secondary | ICD-10-CM | POA: Diagnosis not present

## 2017-01-17 DIAGNOSIS — I70213 Atherosclerosis of native arteries of extremities with intermittent claudication, bilateral legs: Secondary | ICD-10-CM | POA: Diagnosis not present

## 2017-01-28 DIAGNOSIS — Z95820 Peripheral vascular angioplasty status with implants and grafts: Secondary | ICD-10-CM | POA: Diagnosis not present

## 2017-01-28 DIAGNOSIS — I714 Abdominal aortic aneurysm, without rupture: Secondary | ICD-10-CM | POA: Diagnosis not present

## 2017-01-28 DIAGNOSIS — G458 Other transient cerebral ischemic attacks and related syndromes: Secondary | ICD-10-CM | POA: Diagnosis not present

## 2017-01-28 DIAGNOSIS — I70219 Atherosclerosis of native arteries of extremities with intermittent claudication, unspecified extremity: Secondary | ICD-10-CM | POA: Diagnosis not present

## 2017-01-28 DIAGNOSIS — I6523 Occlusion and stenosis of bilateral carotid arteries: Secondary | ICD-10-CM | POA: Diagnosis not present

## 2017-01-28 DIAGNOSIS — I70213 Atherosclerosis of native arteries of extremities with intermittent claudication, bilateral legs: Secondary | ICD-10-CM | POA: Diagnosis not present

## 2017-01-28 DIAGNOSIS — K551 Chronic vascular disorders of intestine: Secondary | ICD-10-CM | POA: Diagnosis not present

## 2017-01-31 ENCOUNTER — Telehealth: Payer: Self-pay | Admitting: Family Medicine

## 2017-01-31 NOTE — Telephone Encounter (Signed)
°  Relation to [id number] Call back number:[phone] Pharmacy: [place], [place] [phone] (Phone) [phone] (Fax)     Reason for call:  Patient requesting a refill clopidogrel (PLAVIX) 75 MG tablet

## 2017-02-01 MED ORDER — CLOPIDOGREL BISULFATE 75 MG PO TABS: 75.0000 mg | ORAL_TABLET | Freq: Every day | ORAL | 0 refills | Status: DC

## 2017-02-01 NOTE — Telephone Encounter (Signed)
Refill sent to [facility]

## 2017-02-02 ENCOUNTER — Telehealth: Payer: Self-pay | Admitting: Family Medicine

## 2017-02-02 NOTE — Telephone Encounter (Signed)
Caller name: Elmo Putt Relationship to patient: [facility] Can be reached: [phone] Pharmacy:  [place], [place] [phone] (Phone) [phone] (Fax)     Reason for call: Need to clarify directions for the Rx clopidogrel (PLAVIX) 75 MG tablet

## 2017-02-07 DIAGNOSIS — M25512 Pain in left shoulder: Secondary | ICD-10-CM | POA: Diagnosis not present

## 2017-02-07 DIAGNOSIS — G8929 Other chronic pain: Secondary | ICD-10-CM | POA: Diagnosis not present

## 2017-02-07 DIAGNOSIS — I70213 Atherosclerosis of native arteries of extremities with intermittent claudication, bilateral legs: Secondary | ICD-10-CM | POA: Diagnosis not present

## 2017-02-07 DIAGNOSIS — M545 Low back pain: Secondary | ICD-10-CM | POA: Diagnosis not present

## 2017-02-08 MED ORDER — CLOPIDOGREL BISULFATE 75 MG PO TABS: 75.0000 mg | ORAL_TABLET | Freq: Every day | ORAL | 0 refills | Status: DC

## 2017-02-08 NOTE — Addendum Note (Signed)
Addended by: Sharon Seller B on: 02/08/2017 09:58 AM   Modules accepted: Orders

## 2017-02-08 NOTE — Telephone Encounter (Signed)
Sent to mail order

## 2017-02-09 ENCOUNTER — Ambulatory Visit: Payer: Medicare Other | Admitting: Family Medicine

## 2017-02-10 DIAGNOSIS — H353223 Exudative age-related macular degeneration, left eye, with inactive scar: Secondary | ICD-10-CM | POA: Diagnosis not present

## 2017-02-10 DIAGNOSIS — H353211 Exudative age-related macular degeneration, right eye, with active choroidal neovascularization: Secondary | ICD-10-CM | POA: Diagnosis not present

## 2017-02-18 DIAGNOSIS — M25512 Pain in left shoulder: Secondary | ICD-10-CM | POA: Diagnosis not present

## 2017-02-18 DIAGNOSIS — M7542 Impingement syndrome of left shoulder: Secondary | ICD-10-CM | POA: Diagnosis not present

## 2017-02-25 DIAGNOSIS — I70219 Atherosclerosis of native arteries of extremities with intermittent claudication, unspecified extremity: Secondary | ICD-10-CM | POA: Diagnosis not present

## 2017-02-25 DIAGNOSIS — Z01818 Encounter for other preprocedural examination: Secondary | ICD-10-CM | POA: Diagnosis not present

## 2017-03-01 DIAGNOSIS — Z5309 Procedure and treatment not carried out because of other contraindication: Secondary | ICD-10-CM | POA: Diagnosis not present

## 2017-03-01 DIAGNOSIS — F411 Generalized anxiety disorder: Secondary | ICD-10-CM | POA: Diagnosis not present

## 2017-03-01 DIAGNOSIS — I6523 Occlusion and stenosis of bilateral carotid arteries: Secondary | ICD-10-CM | POA: Diagnosis not present

## 2017-03-01 DIAGNOSIS — M810 Age-related osteoporosis without current pathological fracture: Secondary | ICD-10-CM | POA: Diagnosis not present

## 2017-03-01 DIAGNOSIS — I2510 Atherosclerotic heart disease of native coronary artery without angina pectoris: Secondary | ICD-10-CM | POA: Diagnosis not present

## 2017-03-01 DIAGNOSIS — Z961 Presence of intraocular lens: Secondary | ICD-10-CM | POA: Diagnosis not present

## 2017-03-01 DIAGNOSIS — N183 Chronic kidney disease, stage 3 (moderate): Secondary | ICD-10-CM | POA: Diagnosis not present

## 2017-03-01 DIAGNOSIS — I70213 Atherosclerosis of native arteries of extremities with intermittent claudication, bilateral legs: Secondary | ICD-10-CM | POA: Diagnosis not present

## 2017-03-01 DIAGNOSIS — Z9861 Coronary angioplasty status: Secondary | ICD-10-CM | POA: Diagnosis not present

## 2017-03-01 DIAGNOSIS — E1122 Type 2 diabetes mellitus with diabetic chronic kidney disease: Secondary | ICD-10-CM | POA: Diagnosis not present

## 2017-03-01 DIAGNOSIS — Z87891 Personal history of nicotine dependence: Secondary | ICD-10-CM | POA: Diagnosis not present

## 2017-03-01 DIAGNOSIS — I129 Hypertensive chronic kidney disease with stage 1 through stage 4 chronic kidney disease, or unspecified chronic kidney disease: Secondary | ICD-10-CM | POA: Diagnosis not present

## 2017-03-01 DIAGNOSIS — E559 Vitamin D deficiency, unspecified: Secondary | ICD-10-CM | POA: Diagnosis not present

## 2017-03-01 DIAGNOSIS — M25552 Pain in left hip: Secondary | ICD-10-CM | POA: Diagnosis not present

## 2017-03-01 DIAGNOSIS — Z9981 Dependence on supplemental oxygen: Secondary | ICD-10-CM | POA: Diagnosis not present

## 2017-03-01 DIAGNOSIS — I714 Abdominal aortic aneurysm, without rupture: Secondary | ICD-10-CM | POA: Diagnosis not present

## 2017-03-01 DIAGNOSIS — E1151 Type 2 diabetes mellitus with diabetic peripheral angiopathy without gangrene: Secondary | ICD-10-CM | POA: Diagnosis not present

## 2017-03-01 DIAGNOSIS — M25551 Pain in right hip: Secondary | ICD-10-CM | POA: Diagnosis not present

## 2017-03-01 DIAGNOSIS — G2581 Restless legs syndrome: Secondary | ICD-10-CM | POA: Diagnosis not present

## 2017-03-01 DIAGNOSIS — Z538 Procedure and treatment not carried out for other reasons: Secondary | ICD-10-CM | POA: Diagnosis not present

## 2017-03-01 DIAGNOSIS — K219 Gastro-esophageal reflux disease without esophagitis: Secondary | ICD-10-CM | POA: Diagnosis not present

## 2017-03-01 DIAGNOSIS — F4310 Post-traumatic stress disorder, unspecified: Secondary | ICD-10-CM | POA: Diagnosis not present

## 2017-03-01 DIAGNOSIS — M545 Low back pain: Secondary | ICD-10-CM | POA: Diagnosis not present

## 2017-03-01 DIAGNOSIS — J449 Chronic obstructive pulmonary disease, unspecified: Secondary | ICD-10-CM | POA: Diagnosis not present

## 2017-03-01 DIAGNOSIS — Z96642 Presence of left artificial hip joint: Secondary | ICD-10-CM | POA: Diagnosis not present

## 2017-03-16 ENCOUNTER — Ambulatory Visit (INDEPENDENT_AMBULATORY_CARE_PROVIDER_SITE_OTHER): Payer: Medicare Other | Admitting: Family Medicine

## 2017-03-16 ENCOUNTER — Encounter: Payer: Self-pay | Admitting: Family Medicine

## 2017-03-16 VITALS — BP 130/50 | HR 71 | Temp 97.7°F | Ht 63.5 in | Wt 134.6 lb

## 2017-03-16 DIAGNOSIS — D649 Anemia, unspecified: Secondary | ICD-10-CM

## 2017-03-16 DIAGNOSIS — R5383 Other fatigue: Secondary | ICD-10-CM | POA: Diagnosis not present

## 2017-03-16 DIAGNOSIS — G2581 Restless legs syndrome: Secondary | ICD-10-CM

## 2017-03-16 LAB — FERRITIN: FERRITIN: 171.9 ng/mL (ref 10.0–291.0)

## 2017-03-16 LAB — TSH: TSH: 1.41 u[IU]/mL (ref 0.35–4.50)

## 2017-03-16 LAB — IBC PANEL
Iron: 48 ug/dL (ref 42–145)
SATURATION RATIOS: 12.7 % — AB (ref 20.0–50.0)
TRANSFERRIN: 271 mg/dL (ref 212.0–360.0)

## 2017-03-16 NOTE — Patient Instructions (Addendum)
If you do not hear anything about your referral in the next 1-2 weeks, call our office and ask for an update.  Give [place] 2-3 business days to get your lab results back.

## 2017-03-16 NOTE — Progress Notes (Signed)
Pre visit review using our clinic review tool, if applicable. No additional management support is needed unless otherwise documented below in the visit note. 

## 2017-03-16 NOTE — Progress Notes (Signed)
Chief Complaint  Patient presents with  . Restless legs    chronic    Subjective: Patient is a 76 y.o. female here for restless legs.  For the past 15 years, she has been having shaking legs. It normally lasts for a few moments. She is typically sleeping and does not realize it is happening. She does not have a desire to move legs and does not feel relief once she does. She has never had a sleep study. She was placed on Sinemet for this. She does not follow with a Neurologist.  She has never had her iron levels checked that she can think of.  Pt also reports 1 week of fatigue/sleepiness. No changes that she is aware of, she is still sleeping normally. She is unsure if she snores, but will wake up with a dry mouth. Her mood is unchanged. She has lost couple pounds, however this was intentional. The pt notes that she has almost fallen asleep at the wheel on a couple occasions.  ROS: Neuro: +shaking of legs Endo: No unintentional weight loss  Family History  Problem Relation Age of Onset  . Heart disease Mother   . Heart disease Father    Past Medical History:  Diagnosis Date  . Asthma   . COPD (chronic obstructive pulmonary disease) ([place])   . Diabetes mellitus without complication ([place])   . Hypertension   . Post traumatic stress disorder (PTSD)    Allergies  Allergen Reactions  . Ivp Dye [Iodinated Diagnostic Agents]     unknown  . Tape Other (See Comments)    Pt. reports that it gives her blisters.    Current Outpatient Prescriptions:  .  albuterol (PROVENTIL HFA;VENTOLIN HFA) 108 (90 Base) MCG/ACT inhaler, Inhale 2 puffs into the lungs every 6 (six) hours as needed for wheezing or shortness of breath., Disp: 1 Inhaler, Rfl: 0 .  atorvastatin (LIPITOR) 40 MG tablet, Take 1 tablet (40 mg total) by mouth daily., Disp: 90 tablet, Rfl: 3 .  calcium carbonate (OS-CAL) 600 MG TABS tablet, Take 600 mg by mouth 2 (two) times daily with a meal., Disp: , Rfl:  .  carbidopa-levodopa  (SINEMET IR) 25-100 MG tablet, Take 1 tablet by mouth at bedtime., Disp: 30 tablet, Rfl: 0 .  Cholecalciferol (VITAMIN D3) 2000 units TABS, Take by mouth daily., Disp: , Rfl:  .  clopidogrel (PLAVIX) 75 MG tablet, Take 1 tablet (75 mg total) by mouth daily., Disp: 90 tablet, Rfl: 0 .  fexofenadine (ALLEGRA) 180 MG tablet, Take 180 mg by mouth daily., Disp: , Rfl:  .  metoprolol tartrate (LOPRESSOR) 25 MG tablet, Take 1 tablet (25 mg total) by mouth 2 (two) times daily., Disp: 180 tablet, Rfl: 1 .  sertraline (ZOLOFT) 100 MG tablet, Take 1 tablet (100 mg total) by mouth daily., Disp: 90 tablet, Rfl: 1 .  sitaGLIPtin-metformin (JANUMET) 50-1000 MG tablet, Take 1 tablet by mouth 2 (two) times daily with a meal., Disp: 180 tablet, Rfl: 1 .  tiotropium (SPIRIVA) 18 MCG inhalation capsule, Place into inhaler and inhale. Place 3mcg into inhaler and inhale daily as needed., Disp: , Rfl:  .  valsartan-hydrochlorothiazide (DIOVAN-HCT) 320-12.5 MG tablet, Take 1 tablet by mouth daily., Disp: 90 tablet, Rfl: 1  Objective: BP (!) 130/50 (BP Location: Left Arm, Patient Position: Sitting, Cuff Size: Normal)   Pulse 71   Temp 97.7 F (36.5 C) (Oral)   Ht 5' 3.5" (1.613 m)   Wt 134 lb 9.6 oz (61.1 kg)  SpO2 96%   BMI 23.47 kg/m  General: Awake, appears stated age HEENT: MMM, EOMi Heart: RRR, +b/l bruits, loudest on L, no LE edema Lungs: CTAB, no rales, wheezes or rhonchi. No accessory muscle use Abd: BS+, soft, NT, ND, no masses or organomegaly MSK: No TTP of LE's b/l, 5/5 strength in Le's b/l with exception of 4/5 L hip flexion Psych: Age appropriate judgment and insight, normal affect and mood  Assessment and Plan: Restless legs - Plan: Ambulatory referral to Pulmonology  Fatigue, unspecified type - Plan: CBC, TSH  Anemia, unspecified type - Plan: Ferritin, IBC panel  Orders as above. Hopeful for sleep study to evaluate for possible periodic limb movement disorder and OSA. More concerned about  former. Suggested she avoid driving when able given her propensity to fall asleep. F/u in 4 weeks.  The patient voiced understanding and agreement to the plan.  [place], DO 03/16/17  12:15 PM

## 2017-03-18 ENCOUNTER — Encounter: Payer: Self-pay | Admitting: *Deleted

## 2017-03-18 LAB — CBC
HCT: 38.6 % (ref 36.0–46.0)
Hemoglobin: 12.5 g/dL (ref 12.0–15.0)
MCHC: 32.3 g/dL (ref 30.0–36.0)
MCV: 85.7 fl (ref 78.0–100.0)
Platelets: 244 10*3/uL (ref 150.0–400.0)
RBC: 4.5 Mil/uL (ref 3.87–5.11)
RDW: 15.2 % (ref 11.5–15.5)
WBC: 10.4 10*3/uL (ref 4.0–10.5)

## 2017-03-29 DIAGNOSIS — H353211 Exudative age-related macular degeneration, right eye, with active choroidal neovascularization: Secondary | ICD-10-CM | POA: Diagnosis not present

## 2017-04-11 ENCOUNTER — Telehealth: Payer: Self-pay | Admitting: Pulmonary Disease

## 2017-04-11 NOTE — Telephone Encounter (Signed)
Left message for patient to call us back to reschedule her consult on 5/9 at 230 with Dr. Elsworth Soho. Dr. Elsworth Soho will need to leave the office at 200 that day and is willing to see her 5/8 at 12pm.

## 2017-04-12 ENCOUNTER — Ambulatory Visit (INDEPENDENT_AMBULATORY_CARE_PROVIDER_SITE_OTHER): Payer: Medicare Other | Admitting: Pulmonary Disease

## 2017-04-12 ENCOUNTER — Encounter: Payer: Self-pay | Admitting: Pulmonary Disease

## 2017-04-12 ENCOUNTER — Ambulatory Visit (INDEPENDENT_AMBULATORY_CARE_PROVIDER_SITE_OTHER)
Admission: RE | Admit: 2017-04-12 | Discharge: 2017-04-12 | Disposition: A | Discharge status: Home / Self Care | Payer: Medicare Other | Source: Ambulatory Visit | Attending: Pulmonary Disease | Admitting: Pulmonary Disease

## 2017-04-12 VITALS — BP 132/52 | HR 74 | Ht 63.0 in | Wt 134.0 lb

## 2017-04-12 DIAGNOSIS — R0602 Shortness of breath: Secondary | ICD-10-CM | POA: Diagnosis not present

## 2017-04-12 DIAGNOSIS — G4710 Hypersomnia, unspecified: Secondary | ICD-10-CM

## 2017-04-12 DIAGNOSIS — G4733 Obstructive sleep apnea (adult) (pediatric): Secondary | ICD-10-CM | POA: Insufficient documentation

## 2017-04-12 DIAGNOSIS — G2581 Restless legs syndrome: Secondary | ICD-10-CM | POA: Diagnosis not present

## 2017-04-12 DIAGNOSIS — J449 Chronic obstructive pulmonary disease, unspecified: Secondary | ICD-10-CM

## 2017-04-12 MED ORDER — FLUTICASONE-UMECLIDIN-VILANT 100-62.5-25 MCG/INH IN AEPB: 1.0000 | INHALATION_SPRAY | Freq: Every day | RESPIRATORY_TRACT | 0 refills | Status: AC

## 2017-04-12 MED ORDER — ROPINIROLE HCL 1 MG PO TABS: ORAL_TABLET | ORAL | 0 refills | Status: DC
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
## 2017-04-12 NOTE — Patient Instructions (Addendum)
Chest x-ray today Lung function is at 35% Trial of Trelegy once daily - call us for R xif this works  Schedule sleep study -evaluate for restless legs as well as need for oxygen during sleep  Stop taking levodopa Started taking Requip 1 mg -1 hour before bedtime- #20, call back in one week to report. If needed we can increase the dose by half tablet

## 2017-04-12 NOTE — Assessment & Plan Note (Addendum)
Stop taking levodopa Started taking Requip 1 mg -1 hour before bedtime- #20, call back in one week to report. If needed we can increase the dose by half tablet

## 2017-04-12 NOTE — Assessment & Plan Note (Signed)
Schedule sleep study -evaluate for hypersomnolence, restless legs as well as need for oxygen during sleep We'll schedule PSG only after Requip was titrated to maximum

## 2017-04-12 NOTE — Assessment & Plan Note (Signed)
Chest x-ray today -for resolution of right lower lobe pneumonia noted 11/20 from [place] of Trelegy once daily - call us for R xif this works

## 2017-04-12 NOTE — Progress Notes (Signed)
   Subjective:    Patient ID: Dawn Stewart, female    DOB: November 13, 1941, 76 y.o.   MRN: [id number]  HPI    Review of Systems  Constitutional: Negative for fever and unexpected weight change.  HENT: Positive for dental problem. Negative for congestion, ear pain, nosebleeds, postnasal drip, rhinorrhea, sinus pressure, sneezing, sore throat and trouble swallowing.   Eyes: Negative for redness and itching.  Respiratory: Positive for shortness of breath. Negative for cough, chest tightness and wheezing.   Cardiovascular: Negative for palpitations and leg swelling.  Gastrointestinal: Negative for nausea and vomiting.  Genitourinary: Negative for dysuria.  Musculoskeletal: Negative for joint swelling.  Skin: Negative for rash.  Neurological: Negative for headaches.  Hematological: Does not bruise/bleed easily.  Psychiatric/Behavioral: Negative for dysphoric mood. The patient is nervous/anxious.        Objective:   Physical Exam        Assessment & Plan:

## 2017-04-12 NOTE — Progress Notes (Signed)
Patient ID: Dawn Stewart, female   DOB: 06/30/1941, 76 y.o.   MRN: [id number] Patient seen in the office today and instructed on use of TRELEGY ELLIPTA.  Patient expressed understanding and demonstrated technique.

## 2017-04-13 ENCOUNTER — Institutional Professional Consult (permissible substitution): Payer: Medicare Other | Admitting: Pulmonary Disease

## 2017-04-13 ENCOUNTER — Ambulatory Visit: Payer: Medicare Other | Admitting: Family Medicine

## 2017-04-13 DIAGNOSIS — I252 Old myocardial infarction: Secondary | ICD-10-CM | POA: Diagnosis not present

## 2017-04-13 DIAGNOSIS — F17211 Nicotine dependence, cigarettes, in remission: Secondary | ICD-10-CM | POA: Diagnosis not present

## 2017-04-13 DIAGNOSIS — I080 Rheumatic disorders of both mitral and aortic valves: Secondary | ICD-10-CM | POA: Diagnosis not present

## 2017-04-13 DIAGNOSIS — I2121 ST elevation (STEMI) myocardial infarction involving left circumflex coronary artery: Secondary | ICD-10-CM | POA: Diagnosis not present

## 2017-04-13 DIAGNOSIS — Z955 Presence of coronary angioplasty implant and graft: Secondary | ICD-10-CM | POA: Diagnosis not present

## 2017-04-13 DIAGNOSIS — I6602 Occlusion and stenosis of left middle cerebral artery: Secondary | ICD-10-CM | POA: Diagnosis present

## 2017-04-13 DIAGNOSIS — M62838 Other muscle spasm: Secondary | ICD-10-CM | POA: Diagnosis present

## 2017-04-13 DIAGNOSIS — R079 Chest pain, unspecified: Secondary | ICD-10-CM | POA: Diagnosis not present

## 2017-04-13 DIAGNOSIS — J449 Chronic obstructive pulmonary disease, unspecified: Secondary | ICD-10-CM | POA: Diagnosis present

## 2017-04-13 DIAGNOSIS — Z66 Do not resuscitate: Secondary | ICD-10-CM | POA: Diagnosis present

## 2017-04-13 DIAGNOSIS — K219 Gastro-esophageal reflux disease without esophagitis: Secondary | ICD-10-CM | POA: Diagnosis present

## 2017-04-13 DIAGNOSIS — T82867A Thrombosis of cardiac prosthetic devices, implants and grafts, initial encounter: Secondary | ICD-10-CM | POA: Diagnosis not present

## 2017-04-13 DIAGNOSIS — R072 Precordial pain: Secondary | ICD-10-CM | POA: Diagnosis not present

## 2017-04-13 DIAGNOSIS — I493 Ventricular premature depolarization: Secondary | ICD-10-CM | POA: Diagnosis not present

## 2017-04-13 DIAGNOSIS — Z87891 Personal history of nicotine dependence: Secondary | ICD-10-CM | POA: Diagnosis not present

## 2017-04-13 DIAGNOSIS — I25118 Atherosclerotic heart disease of native coronary artery with other forms of angina pectoris: Secondary | ICD-10-CM | POA: Diagnosis not present

## 2017-04-13 DIAGNOSIS — I739 Peripheral vascular disease, unspecified: Secondary | ICD-10-CM | POA: Diagnosis present

## 2017-04-13 DIAGNOSIS — T82855A Stenosis of coronary artery stent, initial encounter: Secondary | ICD-10-CM | POA: Diagnosis not present

## 2017-04-13 DIAGNOSIS — I2119 ST elevation (STEMI) myocardial infarction involving other coronary artery of inferior wall: Secondary | ICD-10-CM | POA: Diagnosis present

## 2017-04-13 DIAGNOSIS — R112 Nausea with vomiting, unspecified: Secondary | ICD-10-CM | POA: Diagnosis not present

## 2017-04-13 DIAGNOSIS — R4182 Altered mental status, unspecified: Secondary | ICD-10-CM | POA: Diagnosis not present

## 2017-04-13 DIAGNOSIS — K224 Dyskinesia of esophagus: Secondary | ICD-10-CM | POA: Diagnosis not present

## 2017-04-13 DIAGNOSIS — R1312 Dysphagia, oropharyngeal phase: Secondary | ICD-10-CM | POA: Diagnosis present

## 2017-04-13 DIAGNOSIS — J942 Hemothorax: Secondary | ICD-10-CM | POA: Diagnosis not present

## 2017-04-13 DIAGNOSIS — I499 Cardiac arrhythmia, unspecified: Secondary | ICD-10-CM | POA: Diagnosis not present

## 2017-04-13 DIAGNOSIS — G2581 Restless legs syndrome: Secondary | ICD-10-CM | POA: Diagnosis present

## 2017-04-13 DIAGNOSIS — E1122 Type 2 diabetes mellitus with diabetic chronic kidney disease: Secondary | ICD-10-CM | POA: Diagnosis not present

## 2017-04-13 DIAGNOSIS — G9340 Encephalopathy, unspecified: Secondary | ICD-10-CM | POA: Diagnosis present

## 2017-04-13 DIAGNOSIS — R1310 Dysphagia, unspecified: Secondary | ICD-10-CM | POA: Diagnosis present

## 2017-04-13 DIAGNOSIS — F329 Major depressive disorder, single episode, unspecified: Secondary | ICD-10-CM | POA: Diagnosis present

## 2017-04-13 DIAGNOSIS — E119 Type 2 diabetes mellitus without complications: Secondary | ICD-10-CM | POA: Diagnosis not present

## 2017-04-13 DIAGNOSIS — E876 Hypokalemia: Secondary | ICD-10-CM | POA: Diagnosis not present

## 2017-04-13 DIAGNOSIS — H5440 Blindness, one eye, unspecified eye: Secondary | ICD-10-CM | POA: Diagnosis present

## 2017-04-13 DIAGNOSIS — R9431 Abnormal electrocardiogram [ECG] [EKG]: Secondary | ICD-10-CM | POA: Diagnosis not present

## 2017-04-13 DIAGNOSIS — K210 Gastro-esophageal reflux disease with esophagitis: Secondary | ICD-10-CM | POA: Diagnosis present

## 2017-04-13 DIAGNOSIS — K004 Disturbances in tooth formation: Secondary | ICD-10-CM | POA: Diagnosis not present

## 2017-04-13 DIAGNOSIS — I639 Cerebral infarction, unspecified: Secondary | ICD-10-CM | POA: Diagnosis not present

## 2017-04-13 DIAGNOSIS — E784 Other hyperlipidemia: Secondary | ICD-10-CM | POA: Diagnosis not present

## 2017-04-13 DIAGNOSIS — E1151 Type 2 diabetes mellitus with diabetic peripheral angiopathy without gangrene: Secondary | ICD-10-CM | POA: Diagnosis not present

## 2017-04-13 DIAGNOSIS — J189 Pneumonia, unspecified organism: Secondary | ICD-10-CM | POA: Diagnosis not present

## 2017-04-13 DIAGNOSIS — F419 Anxiety disorder, unspecified: Secondary | ICD-10-CM | POA: Diagnosis present

## 2017-04-13 DIAGNOSIS — I259 Chronic ischemic heart disease, unspecified: Secondary | ICD-10-CM | POA: Diagnosis present

## 2017-04-13 DIAGNOSIS — Z794 Long term (current) use of insulin: Secondary | ICD-10-CM | POA: Diagnosis not present

## 2017-04-13 DIAGNOSIS — Z9981 Dependence on supplemental oxygen: Secondary | ICD-10-CM | POA: Diagnosis not present

## 2017-04-13 DIAGNOSIS — R001 Bradycardia, unspecified: Secondary | ICD-10-CM | POA: Diagnosis not present

## 2017-04-13 DIAGNOSIS — I340 Nonrheumatic mitral (valve) insufficiency: Secondary | ICD-10-CM | POA: Diagnosis present

## 2017-04-13 DIAGNOSIS — I6523 Occlusion and stenosis of bilateral carotid arteries: Secondary | ICD-10-CM | POA: Diagnosis not present

## 2017-04-13 DIAGNOSIS — I10 Essential (primary) hypertension: Secondary | ICD-10-CM | POA: Diagnosis present

## 2017-04-13 DIAGNOSIS — M6281 Muscle weakness (generalized): Secondary | ICD-10-CM | POA: Diagnosis present

## 2017-04-13 DIAGNOSIS — E1165 Type 2 diabetes mellitus with hyperglycemia: Secondary | ICD-10-CM | POA: Diagnosis not present

## 2017-04-13 DIAGNOSIS — I714 Abdominal aortic aneurysm, without rupture: Secondary | ICD-10-CM | POA: Diagnosis present

## 2017-04-13 DIAGNOSIS — I2510 Atherosclerotic heart disease of native coronary artery without angina pectoris: Secondary | ICD-10-CM | POA: Diagnosis present

## 2017-04-13 DIAGNOSIS — Z8249 Family history of ischemic heart disease and other diseases of the circulatory system: Secondary | ICD-10-CM | POA: Diagnosis not present

## 2017-04-13 DIAGNOSIS — E785 Hyperlipidemia, unspecified: Secondary | ICD-10-CM | POA: Diagnosis present

## 2017-04-13 DIAGNOSIS — I219 Acute myocardial infarction, unspecified: Secondary | ICD-10-CM | POA: Diagnosis not present

## 2017-04-14 ENCOUNTER — Telehealth: Payer: Self-pay | Admitting: Pulmonary Disease

## 2017-04-14 NOTE — Progress Notes (Signed)
Pt's daughter, Althea Grimmer notified of results See 04/14/17

## 2017-04-14 NOTE — Telephone Encounter (Signed)
Rigoberto Noel, MD sent to Valerie Salts, CMA        Clearing of pneumonia noted in November    Spoke with pt and notified of results per Dr. Elsworth Soho. Pt verbalized understanding and denied any questions.  She states to let RA know that the night she suffered an MI the day after her last ov here and is currently at [facility]  They are thinking that she may have also had a stroke  Will forward to RA to make him aware

## 2017-04-15 NOTE — Telephone Encounter (Signed)
noted 

## 2017-04-16 DIAGNOSIS — M6281 Muscle weakness (generalized): Secondary | ICD-10-CM | POA: Diagnosis present

## 2017-04-16 DIAGNOSIS — K004 Disturbances in tooth formation: Secondary | ICD-10-CM | POA: Diagnosis not present

## 2017-04-16 DIAGNOSIS — K219 Gastro-esophageal reflux disease without esophagitis: Secondary | ICD-10-CM | POA: Diagnosis not present

## 2017-04-16 DIAGNOSIS — I6523 Occlusion and stenosis of bilateral carotid arteries: Secondary | ICD-10-CM | POA: Diagnosis not present

## 2017-04-16 DIAGNOSIS — Z955 Presence of coronary angioplasty implant and graft: Secondary | ICD-10-CM | POA: Diagnosis not present

## 2017-04-16 DIAGNOSIS — I739 Peripheral vascular disease, unspecified: Secondary | ICD-10-CM | POA: Diagnosis present

## 2017-04-16 DIAGNOSIS — R1312 Dysphagia, oropharyngeal phase: Secondary | ICD-10-CM | POA: Diagnosis present

## 2017-04-16 DIAGNOSIS — E784 Other hyperlipidemia: Secondary | ICD-10-CM | POA: Diagnosis not present

## 2017-04-16 DIAGNOSIS — J942 Hemothorax: Secondary | ICD-10-CM | POA: Diagnosis not present

## 2017-04-16 DIAGNOSIS — I2119 ST elevation (STEMI) myocardial infarction involving other coronary artery of inferior wall: Secondary | ICD-10-CM | POA: Diagnosis not present

## 2017-04-16 DIAGNOSIS — J189 Pneumonia, unspecified organism: Secondary | ICD-10-CM | POA: Diagnosis not present

## 2017-04-16 DIAGNOSIS — I714 Abdominal aortic aneurysm, without rupture: Secondary | ICD-10-CM | POA: Diagnosis present

## 2017-04-16 DIAGNOSIS — I259 Chronic ischemic heart disease, unspecified: Secondary | ICD-10-CM | POA: Diagnosis present

## 2017-04-16 DIAGNOSIS — E119 Type 2 diabetes mellitus without complications: Secondary | ICD-10-CM | POA: Diagnosis not present

## 2017-04-16 DIAGNOSIS — I25118 Atherosclerotic heart disease of native coronary artery with other forms of angina pectoris: Secondary | ICD-10-CM | POA: Diagnosis not present

## 2017-04-16 DIAGNOSIS — J449 Chronic obstructive pulmonary disease, unspecified: Secondary | ICD-10-CM | POA: Diagnosis present

## 2017-04-16 DIAGNOSIS — K210 Gastro-esophageal reflux disease with esophagitis: Secondary | ICD-10-CM | POA: Diagnosis present

## 2017-04-16 DIAGNOSIS — I2121 ST elevation (STEMI) myocardial infarction involving left circumflex coronary artery: Secondary | ICD-10-CM | POA: Diagnosis not present

## 2017-04-16 DIAGNOSIS — M62838 Other muscle spasm: Secondary | ICD-10-CM | POA: Diagnosis present

## 2017-04-16 DIAGNOSIS — I2510 Atherosclerotic heart disease of native coronary artery without angina pectoris: Secondary | ICD-10-CM | POA: Diagnosis not present

## 2017-04-18 DIAGNOSIS — K219 Gastro-esophageal reflux disease without esophagitis: Secondary | ICD-10-CM | POA: Diagnosis not present

## 2017-04-18 DIAGNOSIS — I2510 Atherosclerotic heart disease of native coronary artery without angina pectoris: Secondary | ICD-10-CM | POA: Diagnosis not present

## 2017-04-19 ENCOUNTER — Telehealth: Payer: Self-pay | Admitting: Family Medicine

## 2017-04-19 NOTE — Telephone Encounter (Signed)
[facility] - Client TELEPHONE ADVICE RECORD [facility]  Patient Name: Dawn Stewart  DOB: 02/20/1941    Initial Comment Caller states c/o chest pain, has had 2 nitroglycerin and fells better. Phone # is for the rehab she is staying at.    Nurse Assessment  Nurse: Harlow Mares, RN, Suanne Marker Date/Time (Eastern Time): 04/19/2017 11:20:05 AM  Confirm and document reason for call. If symptomatic, describe symptoms. ---Caller states c/o chest pain, has had 2 nitroglycerin and fells better. Phone # is for the rehab she is staying at. Caller's nurse came onto the phone and explained that the patient is currently at [facility] for rehab therapy following a cardiac cath for chest pain which was negative. She has given the patient 2 NTG this morning which the patient stated relieved her CP but she is still a bit agitated. She just gave her meds for anxiety and will monitor her to see if she improves.  Does the patient have any new or worsening symptoms? ---No  Please document clinical information provided and list any resource used. ---Advised that nurse will make a note of this and advised to call back if caller has continued or worsening symptoms. Nurse voiced understanding.     Guidelines    Guideline Title Affirmed Question Affirmed Notes       Final Disposition User   Clinical Call Harlow Mares, RN, Suanne Marker

## 2017-04-25 DIAGNOSIS — R1310 Dysphagia, unspecified: Secondary | ICD-10-CM | POA: Diagnosis not present

## 2017-04-25 DIAGNOSIS — K219 Gastro-esophageal reflux disease without esophagitis: Secondary | ICD-10-CM | POA: Diagnosis not present

## 2017-04-25 DIAGNOSIS — Z86010 Personal history of colonic polyps: Secondary | ICD-10-CM | POA: Diagnosis not present

## 2017-04-27 DIAGNOSIS — I714 Abdominal aortic aneurysm, without rupture: Secondary | ICD-10-CM | POA: Diagnosis not present

## 2017-04-27 DIAGNOSIS — I771 Stricture of artery: Secondary | ICD-10-CM | POA: Diagnosis not present

## 2017-04-27 DIAGNOSIS — I779 Disorder of arteries and arterioles, unspecified: Secondary | ICD-10-CM | POA: Diagnosis not present

## 2017-04-27 DIAGNOSIS — I70213 Atherosclerosis of native arteries of extremities with intermittent claudication, bilateral legs: Secondary | ICD-10-CM | POA: Diagnosis not present

## 2017-04-27 DIAGNOSIS — N182 Chronic kidney disease, stage 2 (mild): Secondary | ICD-10-CM | POA: Diagnosis not present

## 2017-04-27 DIAGNOSIS — I213 ST elevation (STEMI) myocardial infarction of unspecified site: Secondary | ICD-10-CM | POA: Diagnosis not present

## 2017-04-27 DIAGNOSIS — J449 Chronic obstructive pulmonary disease, unspecified: Secondary | ICD-10-CM | POA: Diagnosis not present

## 2017-04-27 DIAGNOSIS — I25119 Atherosclerotic heart disease of native coronary artery with unspecified angina pectoris: Secondary | ICD-10-CM | POA: Diagnosis not present

## 2017-04-27 DIAGNOSIS — I774 Celiac artery compression syndrome: Secondary | ICD-10-CM | POA: Diagnosis not present

## 2017-04-27 DIAGNOSIS — I739 Peripheral vascular disease, unspecified: Secondary | ICD-10-CM | POA: Diagnosis not present

## 2017-04-27 DIAGNOSIS — E1122 Type 2 diabetes mellitus with diabetic chronic kidney disease: Secondary | ICD-10-CM | POA: Diagnosis not present

## 2017-05-09 ENCOUNTER — Ambulatory Visit (INDEPENDENT_AMBULATORY_CARE_PROVIDER_SITE_OTHER): Payer: Medicare Other | Admitting: Family Medicine

## 2017-05-09 ENCOUNTER — Encounter: Payer: Self-pay | Admitting: Family Medicine

## 2017-05-09 VITALS — BP 140/50 | HR 97 | Temp 98.5°F | Ht 63.0 in | Wt 132.4 lb

## 2017-05-09 DIAGNOSIS — Z09 Encounter for follow-up examination after completed treatment for conditions other than malignant neoplasm: Secondary | ICD-10-CM | POA: Diagnosis not present

## 2017-05-09 NOTE — Progress Notes (Signed)
Chief Complaint  Patient presents with  . Hospitalization Follow-up    for Heart attack    Subjective: Patient is a 76 y.o. female here for hospital f/u. Here with her daughter today.  Pt had MI and received emergent catheterization with placement of DES in L circumflex. Previous stent had a thrombosis. It was recanalized and a new stent was placed. She is now on ASA and Brilinta. She is having easy bruising, but no areas of bleeding. No current CP, SOB, fevers, vision changes. She does have leg pain, but her vascular surgeon believes this is related to PAD and wishes to do further intervention.   ROS: Heart: Denies chest pain  Lungs: Denies SOB   Family History  Problem Relation Age of Onset  . Heart disease Mother   . Heart disease Father    Past Medical History:  Diagnosis Date  . Asthma   . COPD (chronic obstructive pulmonary disease) ([place])   . Diabetes mellitus without complication ([place])   . Hypertension   . Post traumatic stress disorder (PTSD)    Allergies  Allergen Reactions  . Ivp Dye [Iodinated Diagnostic Agents]     unknown  . Tape Other (See Comments)    Pt. reports that it gives her blisters.    Current Outpatient Prescriptions:  .  albuterol (PROVENTIL HFA;VENTOLIN HFA) 108 (90 Base) MCG/ACT inhaler, Inhale 2 puffs into the lungs every 6 (six) hours as needed for wheezing or shortness of breath., Disp: 1 Inhaler, Rfl: 0 .  aspirin 81 MG chewable tablet, Chew 81 mg by mouth daily., Disp: , Rfl:  .  atorvastatin (LIPITOR) 80 MG tablet, Take 1 tablet by mouth daily., Disp: , Rfl: 0 .  calcium carbonate (OS-CAL) 600 MG TABS tablet, Take 600 mg by mouth 2 (two) times daily with a meal., Disp: , Rfl:  .  carbidopa-levodopa (SINEMET IR) 25-100 MG tablet, Take 1 tablet by mouth at bedtime., Disp: 30 tablet, Rfl: 0 .  Cholecalciferol (VITAMIN D3) 2000 units TABS, Take by mouth daily., Disp: , Rfl:  .  fexofenadine (ALLEGRA) 180 MG tablet, Take 180 mg by mouth daily.,  Disp: , Rfl:  .  fexofenadine (ALLEGRA) 60 MG tablet, Take 60 mg by mouth daily., Disp: , Rfl:  .  methocarbamol (ROBAXIN) 500 MG tablet, TK 1 AND 1/2 TS PO Q 4 H PRN, Disp: , Rfl: 0 .  metoprolol tartrate (LOPRESSOR) 25 MG tablet, Take 1 tablet (25 mg total) by mouth 2 (two) times daily., Disp: 180 tablet, Rfl: 1 .  nitroGLYCERIN (NITROSTAT) 0.4 MG SL tablet, Place 0.4 mg under the tongue as needed., Disp: , Rfl:  .  omeprazole (PRILOSEC) 40 MG capsule, Take 40 mg by mouth., Disp: , Rfl:  .  rOPINIRole (REQUIP) 1 MG tablet, 1 hour before bedtime, Disp: 20 tablet, Rfl: 0 .  sertraline (ZOLOFT) 100 MG tablet, Take 1 tablet (100 mg total) by mouth daily., Disp: 90 tablet, Rfl: 1 .  sitaGLIPtin-metformin (JANUMET) 50-1000 MG tablet, Take 1 tablet by mouth 2 (two) times daily with a meal., Disp: 180 tablet, Rfl: 1 .  ticagrelor (BRILINTA) 90 MG TABS tablet, Take 90 mg by mouth 2 (two) times daily., Disp: , Rfl:  .  tiotropium (SPIRIVA) 18 MCG inhalation capsule, Place into inhaler and inhale. Place 54mcg into inhaler and inhale daily as needed., Disp: , Rfl:  .  valsartan-hydrochlorothiazide (DIOVAN-HCT) 320-12.5 MG tablet, Take 1 tablet by mouth daily., Disp: 90 tablet, Rfl: 1  Objective: BP Marland Kitchen)  140/50 (BP Location: Right Arm, Patient Position: Sitting, Cuff Size: Normal)   Pulse 97   Temp 98.5 F (36.9 C) (Oral)   Ht 5\' 3"  (1.6 m)   Wt 132 lb 6.4 oz (60.1 kg)   SpO2 94%   BMI 23.45 kg/m  General: Awake, appears stated age HEENT: MMM, EOMi, ears neg b/l Heart: RRR, 2/6 SEM heard loudest at aortic listening post, +b/l carotid bruits, no LE edema Lungs: CTAB, no rales, wheezes or rhonchi. No accessory muscle use Abd: BS+, soft, NT, ND, no masses or organomegaly Psych: Age appropriate judgment and insight, normal affect and mood  Assessment and Plan: Hospital discharge follow-up  Reviewed outside medical records from her cardiologist and her inpatient stay. Will make sure she is taking 80  mg Lipitor daily. Stay on Brilinta and ASA. Gradually increase exercise, instructed to call about cardiac rehab set up through Dr. Quillian Quince. F/u with me in 2 weeks for DM. The patient and her daughter voiced understanding and agreement to the plan.  [place], DO 05/09/17  11:40 AM

## 2017-05-09 NOTE — Patient Instructions (Addendum)
I want you taking 80 mg of Lipitor (atorvastatin) daily.   Try to steadily increase your activity level to help yourself get back to normal.   Stay on aspirin and Brilinta.

## 2017-05-10 ENCOUNTER — Encounter: Payer: Self-pay | Admitting: Adult Health

## 2017-05-10 ENCOUNTER — Ambulatory Visit (INDEPENDENT_AMBULATORY_CARE_PROVIDER_SITE_OTHER): Payer: Medicare Other | Admitting: Adult Health

## 2017-05-10 DIAGNOSIS — J449 Chronic obstructive pulmonary disease, unspecified: Secondary | ICD-10-CM | POA: Diagnosis not present

## 2017-05-10 DIAGNOSIS — G4710 Hypersomnia, unspecified: Secondary | ICD-10-CM

## 2017-05-10 MED ORDER — FLUTICASONE-UMECLIDIN-VILANT 100-62.5-25 MCG/INH IN AEPB: 1.0000 | INHALATION_SPRAY | Freq: Every day | RESPIRATORY_TRACT | 3 refills | Status: DC

## 2017-05-10 NOTE — Patient Instructions (Signed)
Restart on TRELEGY 1 puff daily . Rinse after use. (take everyday ) Reschedule sleep study .  Follow up with Dr. Elsworth Soho  In 3 months and As needed   Please contact office for sooner follow up if symptoms do not improve or worsen or seek emergency care

## 2017-05-10 NOTE — Assessment & Plan Note (Signed)
Reschedule sleep study 

## 2017-05-10 NOTE — Progress Notes (Signed)
@Patient  ID: Dawn Stewart, female    DOB: November 23, 1941, 75 y.o.   MRN: [id number]  No chief complaint on file.   Referring provider: Shelda Stewart*  HPI: 76 yo female former smoker seen for pulmonary /sleep consult 04/2017   Significant tests/ events reviewed Spirometry-FEV1 0.72, 35%, ratio 63  05/10/2017 Follow up : COPD , RLS  Pt returns for 1 month follow up.  Patient was seen last visit for pulmonary consult. She was found to have severe COPD. started on TRELEGY . But not take on regular basis .  We discussed medication compliance. Explain how this medication works with COPD.  Patient was noted have restless leg symptoms.  started on Requip . Took for one night but it did not work.  She was also set up for a sleep study but missed her appointment. Like to have this rescheduled.  Since last ov was admitted for MI . S/p DES in L Circumflex.   Allergies  Allergen Reactions  . Ivp Dye [Iodinated Diagnostic Agents]     unknown  . Tape Other (See Comments)    Pt. reports that it gives her blisters.    Immunization History  Administered Date(s) Administered  . Influenza-Unspecified 09/05/2016  . Zoster 12/07/2007    Past Medical History:  Diagnosis Date  . Asthma   . COPD (chronic obstructive pulmonary disease) ([place])   . Diabetes mellitus without complication ([place])   . Hypertension   . Post traumatic stress disorder (PTSD)     Tobacco History: History  Smoking Status  . Former Smoker  . Packs/day: 1.50  . Years: 65.00  Smokeless Tobacco  . Never Used    Comment: quit smoking 1.5 years ago   Counseling given: Not Answered   Outpatient Encounter Prescriptions as of 05/10/2017  Medication Sig  . albuterol (PROVENTIL HFA;VENTOLIN HFA) 108 (90 Base) MCG/ACT inhaler Inhale 2 puffs into the lungs every 6 (six) hours as needed for wheezing or shortness of breath.  Marland Kitchen aspirin 81 MG chewable tablet Chew 81 mg by mouth daily.  Marland Kitchen atorvastatin (LIPITOR) 80 MG  tablet Take 1 tablet by mouth daily.  . calcium carbonate (OS-CAL) 600 MG TABS tablet Take 600 mg by mouth 2 (two) times daily with a meal.  . carbidopa-levodopa (SINEMET IR) 25-100 MG tablet Take 1 tablet by mouth at bedtime.  . Cholecalciferol (VITAMIN D3) 2000 units TABS Take by mouth daily.  . fexofenadine (ALLEGRA) 60 MG tablet Take 60 mg by mouth daily.  . methocarbamol (ROBAXIN) 500 MG tablet TK 1 AND 1/2 TS PO Q 4 H PRN  . metoprolol tartrate (LOPRESSOR) 25 MG tablet Take 1 tablet (25 mg total) by mouth 2 (two) times daily.  . nitroGLYCERIN (NITROSTAT) 0.4 MG SL tablet Place 0.4 mg under the tongue as needed.  Marland Kitchen omeprazole (PRILOSEC) 40 MG capsule Take 40 mg by mouth.  Marland Kitchen rOPINIRole (REQUIP) 1 MG tablet 1 hour before bedtime  . sertraline (ZOLOFT) 100 MG tablet Take 1 tablet (100 mg total) by mouth daily.  . sitaGLIPtin-metformin (JANUMET) 50-1000 MG tablet Take 1 tablet by mouth 2 (two) times daily with a meal.  . ticagrelor (BRILINTA) 90 MG TABS tablet Take 90 mg by mouth 2 (two) times daily.  . valsartan-hydrochlorothiazide (DIOVAN-HCT) 320-12.5 MG tablet Take 1 tablet by mouth daily.  . fexofenadine (ALLEGRA) 180 MG tablet Take 180 mg by mouth daily.  . Fluticasone-Umeclidin-Vilant (TRELEGY ELLIPTA) 100-62.5-25 MCG/INH AEPB Inhale into the lungs.  . tiotropium (SPIRIVA) 18 MCG  inhalation capsule Place into inhaler and inhale. Place 26mcg into inhaler and inhale daily as needed.   No facility-administered encounter medications on file as of 05/10/2017.      Review of Systems  Constitutional:   No  weight loss, night sweats,  Fevers, chills, fatigue, or  lassitude.  HEENT:   No headaches,  Difficulty swallowing,  Tooth/dental problems, or  Sore throat,                No sneezing, itching, ear ache, nasal congestion, post nasal drip,   CV:  No chest pain,  Orthopnea, PND, swelling in lower extremities, anasarca, dizziness, palpitations, syncope.   GI  No heartburn, indigestion,  abdominal pain, nausea, vomiting, diarrhea, change in bowel habits, loss of appetite, bloody stools.   Resp:    No chest wall deformity  Skin: no rash or lesions.  GU: no dysuria, change in color of urine, no urgency or frequency.  No flank pain, no hematuria   MS:  No joint pain or swelling.  No decreased range of motion.  No back pain.    Physical Exam  BP 112/60 (BP Location: Right Arm, Patient Position: Sitting, Cuff Size: Normal)   Pulse 71   Ht 5\' 3"  (1.6 m)   Wt 132 lb 12.8 oz (60.2 kg)   SpO2 97%   BMI 23.52 kg/m   GEN: A/Ox3; pleasant , NAD, thin    HEENT:  [place]/AT,  EACs-clear, TMs-wnl, NOSE-clear, THROAT-clear, no lesions, no postnasal drip or exudate noted.   NECK:  Supple w/ fair ROM; no JVD; normal carotid impulses w/o bruits; no thyromegaly or nodules palpated; no lymphadenopathy.    RESP  Clear  P & A; w/o, wheezes/ rales/ or rhonchi. no accessory muscle use, no dullness to percussion  CARD:  RRR, no m/r/g, no peripheral edema, pulses intact, no cyanosis or clubbing.  GI:   Soft & nt; nml bowel sounds; no organomegaly or masses detected.   Musco: Warm bil, no deformities or joint swelling noted.   Neuro: alert, no focal deficits noted.    Skin: Warm, no lesions or rashes    Lab Results:  CBC  ProBNP No results found for: PROBNP  Imaging: Dg Chest 2 View  Result Date: 04/12/2017 CLINICAL DATA:  Chronic obstructive pulmonary disease, follow-up pneumonia, shortness of breath, diabetes mellitus, hypertension, former smoker, asthma EXAM: CHEST  2 VIEW COMPARISON:  None FINDINGS: Enlargement of cardiac silhouette. Mediastinal contours and pulmonary vascularity normal. Bronchitic changes and minimal interstitial prominence. No acute infiltrate, pleural effusion or pneumothorax. Bones demineralized. LEFT carotid stent with surgical clips in RIGHT cervical region. IMPRESSION: Mild enlargement of cardiac silhouette. Bronchitic changes without definite acute  infiltrate. Electronically Signed   By: Dawn Stewart M.D.   On: 04/12/2017 15:34     Assessment & Plan:   No problem-specific Assessment & Plan notes found for this encounter.     Dawn Edison, NP 05/10/2017

## 2017-05-10 NOTE — Assessment & Plan Note (Signed)
Severe COPD patient is encouraged on medication compliance  Plan  Patient Instructions  Restart on TRELEGY 1 puff daily . Rinse after use. (take everyday ) Reschedule sleep study .  Follow up with Dr. Elsworth Soho  In 3 months and As needed   Please contact office for sooner follow up if symptoms do not improve or worsen or seek emergency care

## 2017-05-17 DIAGNOSIS — H353211 Exudative age-related macular degeneration, right eye, with active choroidal neovascularization: Secondary | ICD-10-CM | POA: Diagnosis not present

## 2017-05-17 NOTE — Progress Notes (Signed)
Reviewed & agree with plan  

## 2017-05-19 ENCOUNTER — Telehealth: Payer: Self-pay | Admitting: Family Medicine

## 2017-05-19 MED ORDER — CARBIDOPA-LEVODOPA 25-100 MG PO TABS: 1.0000 | ORAL_TABLET | Freq: Every day | ORAL | 3 refills | Status: DC

## 2017-05-19 NOTE — Telephone Encounter (Signed)
Rx approved and sent to the pharmacy by e-script.//AB/CMA 

## 2017-05-19 NOTE — Telephone Encounter (Signed)
°  Relation to FE:XMDY Call back number:[phone] Pharmacy:  [facility], [place] [phone] (Phone) [phone] (Fax)     Reason for call:  Patient requesting a 3 month supply carbidopa-levodopa (SINEMET IR) 25-100 MG tablet

## 2017-05-19 NOTE — Telephone Encounter (Signed)
Requesting 3 month supply for Carbidopa-Levodopa 25-100mg -Take 1 tablet by mouth at bedtime. Last refill:12/29/16 Last OV:05/09/17 Please advise.//AB/CMA

## 2017-05-19 NOTE — Telephone Encounter (Signed)
OK to do. TY.  

## 2017-05-19 NOTE — Addendum Note (Signed)
Addended by: Harl Bowie on: 05/19/2017 05:03 PM   Modules accepted: Orders

## 2017-05-23 ENCOUNTER — Encounter (HOSPITAL_BASED_OUTPATIENT_CLINIC_OR_DEPARTMENT_OTHER): Payer: Self-pay | Admitting: Emergency Medicine

## 2017-05-23 ENCOUNTER — Emergency Department (HOSPITAL_BASED_OUTPATIENT_CLINIC_OR_DEPARTMENT_OTHER)
Admission: EM | Admit: 2017-05-23 | Discharge: 2017-05-23 | Disposition: A | Discharge status: Home / Self Care | Payer: Medicare Other | Attending: Emergency Medicine | Admitting: Emergency Medicine

## 2017-05-23 ENCOUNTER — Emergency Department (HOSPITAL_BASED_OUTPATIENT_CLINIC_OR_DEPARTMENT_OTHER): Payer: Medicare Other

## 2017-05-23 DIAGNOSIS — Z7984 Long term (current) use of oral hypoglycemic drugs: Secondary | ICD-10-CM | POA: Diagnosis not present

## 2017-05-23 DIAGNOSIS — S299XXA Unspecified injury of thorax, initial encounter: Secondary | ICD-10-CM | POA: Diagnosis not present

## 2017-05-23 DIAGNOSIS — R0781 Pleurodynia: Secondary | ICD-10-CM

## 2017-05-23 DIAGNOSIS — R0789 Other chest pain: Secondary | ICD-10-CM | POA: Diagnosis not present

## 2017-05-23 DIAGNOSIS — J449 Chronic obstructive pulmonary disease, unspecified: Secondary | ICD-10-CM | POA: Insufficient documentation

## 2017-05-23 DIAGNOSIS — F32A Depression, unspecified: Secondary | ICD-10-CM

## 2017-05-23 DIAGNOSIS — J45909 Unspecified asthma, uncomplicated: Secondary | ICD-10-CM | POA: Insufficient documentation

## 2017-05-23 DIAGNOSIS — W19XXXA Unspecified fall, initial encounter: Secondary | ICD-10-CM | POA: Diagnosis not present

## 2017-05-23 DIAGNOSIS — R079 Chest pain, unspecified: Secondary | ICD-10-CM | POA: Diagnosis present

## 2017-05-23 DIAGNOSIS — E119 Type 2 diabetes mellitus without complications: Secondary | ICD-10-CM | POA: Insufficient documentation

## 2017-05-23 DIAGNOSIS — Z79899 Other long term (current) drug therapy: Secondary | ICD-10-CM | POA: Diagnosis not present

## 2017-05-23 DIAGNOSIS — F329 Major depressive disorder, single episode, unspecified: Secondary | ICD-10-CM | POA: Diagnosis not present

## 2017-05-23 DIAGNOSIS — Z87891 Personal history of nicotine dependence: Secondary | ICD-10-CM | POA: Diagnosis not present

## 2017-05-23 DIAGNOSIS — I10 Essential (primary) hypertension: Secondary | ICD-10-CM | POA: Diagnosis not present

## 2017-05-23 MED ORDER — HYDROCODONE-ACETAMINOPHEN 5-325 MG PO TABS: 1.0000 | ORAL_TABLET | Freq: Four times a day (QID) | ORAL | 0 refills | Status: DC | PRN

## 2017-05-23 NOTE — ED Notes (Signed)
Pt. Depressed affect during conversation.  Pt. Asked about needs and if any one to talk to... Pt. Reports to RN that she is on medication for depression and has therapist.  Pt. Also stated "I wish God would have taken me after my last heart attack".   RN asked Pt. If she was SI and Pt. Strongly states "No" "Not at all".  "I just feel like I am a burden to my daughter".  RN let the Pt. Speak speak.

## 2017-05-23 NOTE — ED Notes (Signed)
ED Provider at bedside. 

## 2017-05-23 NOTE — ED Provider Notes (Signed)
[facility] Provider Note   CSN: [id number] Arrival date & time: 05/23/17  1549  By signing my name below, I, [name], attest that this documentation has been prepared under the direction and in the presence of Fredia Sorrow, MD. Electronically Signed: [name], ED Scribe. 05/23/17. 5:46 PM.  History   Chief Complaint Chief Complaint  Patient presents with  . Rib Injury   The history is provided by the patient. No language interpreter was used.   HPI Comments: Dawn Stewart is a 76 y.o. female who presents to the Emergency Department complaining of severe, constant right lower anterior chest pain with associated SOB, nausea, and back pain s/p a mechanical fall that occurred a week ago. The pt fell out a chair and landed on her right side and thigh. She has not taken anything for her pain. She states that her pain is exacerbated with deep breathing and palpation. The pt reports that she is currently taking a blood thinner due to an MI. She also has a PMHx of COPD, DM, and HTN. The pt denies fever, rhinorrhea, sore throat, visual disturbances, cough, leg swelling, abdominal pain, vomiting, rash, dysuria, hematuria, headaches, and rash.   Past Medical History:  Diagnosis Date  . Asthma   . COPD (chronic obstructive pulmonary disease) ([place])   . Diabetes mellitus without complication ([place])   . Hypertension   . Post traumatic stress disorder (PTSD)     Patient Active Problem List   Diagnosis Date Noted  . RLS (restless legs syndrome) 04/12/2017  . COPD (chronic obstructive pulmonary disease) ([place]) 04/12/2017  . Hypersomnolence 04/12/2017  . PAD (peripheral artery disease) ([place]) 12/29/2016  . Diabetes mellitus type 2 in nonobese ([place]) 12/29/2016  . PTSD (post-traumatic stress disorder) 12/29/2016    Past Surgical History:  Procedure Laterality Date  . ABDOMINAL HYSTERECTOMY    . APPENDECTOMY    . BRAIN SURGERY    . TONSILLECTOMY    . TOTAL HIP  ARTHROPLASTY      OB History    No data available       Home Medications    Prior to Admission medications   Medication Sig Start Date End Date Taking? Authorizing Provider  albuterol (PROVENTIL HFA;VENTOLIN HFA) 108 (90 Base) MCG/ACT inhaler Inhale 2 puffs into the lungs every 6 (six) hours as needed for wheezing or shortness of breath. 12/29/16   Wendling, Crosby Oyster, DO  atorvastatin (LIPITOR) 80 MG tablet Take 1 tablet by mouth daily. 04/16/17   [provider]  calcium carbonate (OS-CAL) 600 MG TABS tablet Take 600 mg by mouth 2 (two) times daily with a meal.    [provider]  carbidopa-levodopa (SINEMET IR) 25-100 MG tablet Take 1 tablet by mouth at bedtime. 05/19/17   [name], DO  Cholecalciferol (VITAMIN D3) 2000 units TABS Take by mouth daily.    [provider]  fexofenadine (ALLEGRA) 180 MG tablet Take 180 mg by mouth daily.    [provider]  fexofenadine (ALLEGRA) 60 MG tablet Take 60 mg by mouth daily.    [provider]  Fluticasone-Umeclidin-Vilant (TRELEGY ELLIPTA) 100-62.5-25 MCG/INH AEPB Inhale into the lungs.    [provider]  Fluticasone-Umeclidin-Vilant (TRELEGY ELLIPTA) 100-62.5-25 MCG/INH AEPB Inhale 1 puff into the lungs daily. 05/10/17   Parrett, Fonnie Mu, NP  HYDROcodone-acetaminophen (NORCO/VICODIN) 5-325 MG tablet Take 1-2 tablets by mouth every 6 (six) hours as needed for moderate pain. 05/23/17   Fredia Sorrow, MD  methocarbamol (ROBAXIN) 500  MG tablet TK 1 AND 1/2 TS PO Q 4 H PRN 04/16/17   [provider]  metoprolol tartrate (LOPRESSOR) 25 MG tablet Take 1 tablet (25 mg total) by mouth 2 (two) times daily. 12/29/16   [name], DO  nitroGLYCERIN (NITROSTAT) 0.4 MG SL tablet Place 0.4 mg under the tongue as needed. 04/16/17 04/16/18  [provider]  omeprazole (PRILOSEC) 40 MG capsule Take 40 mg by mouth. 04/25/17 05/25/17  [provider]    rOPINIRole (REQUIP) 1 MG tablet 1 hour before bedtime 04/12/17   Rigoberto Noel, MD  sertraline (ZOLOFT) 100 MG tablet Take 1 tablet (100 mg total) by mouth daily. 12/29/16   [name], DO  sitaGLIPtin-metformin (JANUMET) 50-1000 MG tablet Take 1 tablet by mouth 2 (two) times daily with a meal. 12/29/16   Wendling, Crosby Oyster, DO  tiotropium (SPIRIVA) 18 MCG inhalation capsule Place into inhaler and inhale. Place 69mcg into inhaler and inhale daily as needed.    [provider]  valsartan-hydrochlorothiazide (DIOVAN-HCT) 320-12.5 MG tablet Take 1 tablet by mouth daily. 12/29/16   [name], DO    Family History Family History  Problem Relation Age of Onset  . Heart disease Mother   . Heart disease Father     Social History Social History  Substance Use Topics  . Smoking status: Former Smoker    Packs/day: 1.50    Years: 65.00  . Smokeless tobacco: Never Used     Comment: quit smoking 1.5 years ago  . Alcohol use No     Allergies   Ivp dye [iodinated diagnostic agents] and Tape   Review of Systems Review of Systems  Constitutional: Negative for fever.  HENT: Negative for rhinorrhea and sore throat.   Eyes: Negative for visual disturbance.  Respiratory: Positive for shortness of breath. Negative for cough.   Cardiovascular: Positive for chest pain. Negative for leg swelling.  Gastrointestinal: Positive for nausea. Negative for abdominal pain and vomiting.  Genitourinary: Negative for dysuria and hematuria.  Musculoskeletal: Positive for back pain.  Skin: Negative for rash.  Neurological: Negative for headaches.  Hematological: Bruises/bleeds easily.     Physical Exam Updated Vital Signs BP (!) 144/55 (BP Location: Right Arm)   Pulse 73   Temp 98.1 F (36.7 C) (Oral)   Resp 18   Ht 1.6 m (5\' 3" )   Wt 59.9 kg (132 lb)   SpO2 95%   BMI 23.38 kg/m   Physical Exam  Constitutional: She is oriented to person, place, and time. She  appears well-developed and well-nourished.  HENT:  Head: Normocephalic.  Mouth/Throat: Mucous membranes are normal. Mucous membranes are not dry.  Eyes: EOM are normal. Pupils are equal, round, and reactive to light. No scleral icterus.  Neck: Normal range of motion.  Cardiovascular: Normal rate, regular rhythm, normal heart sounds and intact distal pulses.  Exam reveals no gallop and no friction rub.   No murmur heard. Pulses:      Radial pulses are 2+ on the right side, and 2+ on the left side.  Pulmonary/Chest: Effort normal.  Decreased breath sounds on the right more so than the left.  Abdominal: Bowel sounds are normal. She exhibits no distension. There is no tenderness.  Musculoskeletal: Normal range of motion.  Tenderness to right anterior chest area No leg swelling  Neurological: She is alert and oriented to person, place, and time. No cranial nerve deficit or sensory deficit. She exhibits normal muscle tone. Coordination normal.  Psychiatric: She has a normal mood and affect.  Nursing note and vitals reviewed.    ED Treatments / Results   DIAGNOSTIC STUDIES: Oxygen Saturation is 95% on RA, low by my interpretation.   COORDINATION OF CARE: 5:15 PM-Discussed next steps with pt. Pt verbalized understanding and is agreeable with the plan.   Labs (all labs ordered are listed, but only abnormal results are displayed) Labs Reviewed - No data to display  EKG  EKG Interpretation None       Radiology Dg Ribs Unilateral W/chest Right  Result Date: 05/23/2017 CLINICAL DATA:  Status post fall out of chair, with injury to right ribs. Acute onset of lateral right lower rib pain, with shortness of breath. Initial encounter. EXAM: RIGHT RIBS AND CHEST - 3+ VIEW COMPARISON:  Chest radiograph performed 04/13/2017 FINDINGS: No displaced rib fractures are seen. The lungs are well-aerated. Mild scarring is noted at the right costophrenic angle. Mild chronic interstitial prominence is  noted. There is no evidence of pleural effusion or pneumothorax. The cardiomediastinal silhouette is borderline enlarged. No acute osseous abnormalities are seen. Scattered clips are noted at the right side of the neck. IMPRESSION: No displaced rib fracture seen. Borderline cardiomegaly. Mild chronic interstitial prominence noted. Electronically Signed   By: Garald Balding M.D.   On: 05/23/2017 16:20    Procedures Procedures (including critical care time)  Medications Ordered in ED Medications - No data to display   Initial Impression / Assessment and Plan / ED Course  I have reviewed the triage vital signs and the nursing notes.  Pertinent labs & imaging results that were available during my care of the patient were reviewed by me and considered in my medical decision making (see chart for details).    Patient symptoms consistent with some mild bruising or fractures of the right anterior lower ribs. Lower cartilage in that area. Syncope injury just to the cartilage. X-ray shows no evidence of any underlying lung injury and no evidence of any rib fractures. The clinically she still at least has bruising. Will treat with pain medicine have her follow-up closely with her primary care doctor.  Nurses raise some concerns the patient expressed some depression. Denied any suicidal ideation. Patient does have a psychiatrist also as mentioned primary care Dr. available to her for consultation she is already on Zoloft.  Final Clinical Impressions(s) / ED Diagnoses   Final diagnoses:  Rib pain on right side  Fall, initial encounter  Depression, unspecified depression type    New Prescriptions New Prescriptions   HYDROCODONE-ACETAMINOPHEN (NORCO/VICODIN) 5-325 MG TABLET    Take 1-2 tablets by mouth every 6 (six) hours as needed for moderate pain.   I personally performed the services described in this documentation, which was scribed in my presence. The recorded information has been reviewed and  is accurate.       Fredia Sorrow, MD 05/23/17 [phone]

## 2017-05-23 NOTE — Discharge Instructions (Signed)
Chest x-ray negative for any underlying lung abnormalities or any evidence of rib fracture. However clinically you definitely have at least a contusion to the ribs or injury to the cartilage of the ribs. Take pain medicine as directed. Make an appointment to follow-up with your primary care Dr. next week or so for recheck. Return for any development of fever or pneumonialike symptoms. This can be a complication of rib injuries. No evidence of that today. Take pain medicine as directed. For any concerns for depression. Follow-up with your primary care doctor or your psychiatrist.

## 2017-05-23 NOTE — ED Notes (Signed)
Pt. Rolling on stretcher with legs in the air.  Pt. Rolling from side to side raising her arms in the air.  Pt. Kicking her legs up and down.

## 2017-05-23 NOTE — ED Triage Notes (Signed)
Patient states that she fell out of a chair about a week ago. Still having pain to her right rib

## 2017-05-27 ENCOUNTER — Encounter: Payer: Self-pay | Admitting: Family Medicine

## 2017-05-27 ENCOUNTER — Ambulatory Visit (INDEPENDENT_AMBULATORY_CARE_PROVIDER_SITE_OTHER): Payer: Medicare Other | Admitting: Family Medicine

## 2017-05-27 VITALS — BP 130/50 | HR 72 | Temp 98.4°F | Ht 63.0 in | Wt 132.6 lb

## 2017-05-27 DIAGNOSIS — Z2821 Immunization not carried out because of patient refusal: Secondary | ICD-10-CM

## 2017-05-27 DIAGNOSIS — E1139 Type 2 diabetes mellitus with other diabetic ophthalmic complication: Secondary | ICD-10-CM | POA: Diagnosis not present

## 2017-05-27 DIAGNOSIS — F4310 Post-traumatic stress disorder, unspecified: Secondary | ICD-10-CM

## 2017-05-27 DIAGNOSIS — E119 Type 2 diabetes mellitus without complications: Secondary | ICD-10-CM | POA: Diagnosis not present

## 2017-05-27 DIAGNOSIS — I10 Essential (primary) hypertension: Secondary | ICD-10-CM | POA: Diagnosis not present

## 2017-05-27 HISTORY — DX: Essential (primary) hypertension: I10

## 2017-05-27 LAB — CBC
HCT: 34.3 % — ABNORMAL LOW (ref 35.0–45.0)
Hemoglobin: 10.9 g/dL — ABNORMAL LOW (ref 11.7–15.5)
MCH: 27.1 pg (ref 27.0–33.0)
MCHC: 31.8 g/dL — AB (ref 32.0–36.0)
MCV: 85.3 fL (ref 80.0–100.0)
MPV: 10.4 fL (ref 7.5–12.5)
PLATELETS: 187 10*3/uL (ref 140–400)
RBC: 4.02 MIL/uL (ref 3.80–5.10)
RDW: 15.6 % — ABNORMAL HIGH (ref 11.0–15.0)
WBC: 8.8 10*3/uL (ref 3.8–10.8)

## 2017-05-27 LAB — COMPREHENSIVE METABOLIC PANEL
ALK PHOS: 103 U/L (ref 33–130)
ALT: 8 U/L (ref 6–29)
AST: 11 U/L (ref 10–35)
Albumin: 4.1 g/dL (ref 3.6–5.1)
BILIRUBIN TOTAL: 0.4 mg/dL (ref 0.2–1.2)
BUN: 17 mg/dL (ref 7–25)
CALCIUM: 9.1 mg/dL (ref 8.6–10.4)
CO2: 24 mmol/L (ref 20–31)
Chloride: 102 mmol/L (ref 98–110)
Creat: 1.11 mg/dL — ABNORMAL HIGH (ref 0.60–0.93)
GLUCOSE: 144 mg/dL — AB (ref 65–99)
Potassium: 4 mmol/L (ref 3.5–5.3)
SODIUM: 139 mmol/L (ref 135–146)
Total Protein: 6.4 g/dL (ref 6.1–8.1)

## 2017-05-27 LAB — LIPID PANEL
CHOL/HDL RATIO: 5.2 ratio — AB (ref <5.0)
Cholesterol: 170 mg/dL (ref <200)
HDL: 33 mg/dL — ABNORMAL LOW (ref >50)
LDL CALC: 95 mg/dL (ref <100)
Triglycerides: 212 mg/dL — ABNORMAL HIGH (ref <150)
VLDL: 42 mg/dL — AB (ref <30)
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
## 2017-05-27 NOTE — Patient Instructions (Signed)
Give us 2-3 business days to get the results of your labs back.  Let us know if you need anything.  

## 2017-05-28 LAB — HEMOGLOBIN A1C
HEMOGLOBIN A1C: 7 % — AB (ref <5.7)
MEAN PLASMA GLUCOSE: 154 mg/dL

## 2017-05-31 LAB — MICROALBUMIN / CREATININE URINE RATIO

## 2017-06-03 ENCOUNTER — Telehealth: Payer: Self-pay | Admitting: Family Medicine

## 2017-06-03 NOTE — Telephone Encounter (Signed)
Please advise.//AB/CMA 

## 2017-06-03 NOTE — Telephone Encounter (Signed)
Self  Refill for Brill Linta, carbidopa-levodopa, sertraline, O nep- Rah-Zall 40 MG (not showing on medication list)     Ph[facility]acy: [facility]   90 day supply

## 2017-06-03 NOTE — Telephone Encounter (Signed)
Pt called back in she said that she also would like to have refill on Brillinta 90 MG (not showing on medication list) and also a baby asprin   Pt says that she would like to have it sent to her local pharmacy which is [facility]

## 2017-06-05 ENCOUNTER — Ambulatory Visit (HOSPITAL_BASED_OUTPATIENT_CLINIC_OR_DEPARTMENT_OTHER): Payer: Medicare Other | Attending: Pulmonary Disease | Admitting: Pulmonary Disease

## 2017-06-05 DIAGNOSIS — R0683 Snoring: Secondary | ICD-10-CM | POA: Insufficient documentation

## 2017-06-05 DIAGNOSIS — R5383 Other fatigue: Secondary | ICD-10-CM | POA: Insufficient documentation

## 2017-06-05 DIAGNOSIS — G4736 Sleep related hypoventilation in conditions classified elsewhere: Secondary | ICD-10-CM | POA: Diagnosis not present

## 2017-06-05 DIAGNOSIS — E119 Type 2 diabetes mellitus without complications: Secondary | ICD-10-CM | POA: Diagnosis not present

## 2017-06-05 DIAGNOSIS — G4733 Obstructive sleep apnea (adult) (pediatric): Secondary | ICD-10-CM | POA: Insufficient documentation

## 2017-06-05 DIAGNOSIS — G2581 Restless legs syndrome: Secondary | ICD-10-CM

## 2017-06-05 DIAGNOSIS — G4719 Other hypersomnia: Secondary | ICD-10-CM | POA: Insufficient documentation

## 2017-06-05 DIAGNOSIS — G4710 Hypersomnia, unspecified: Secondary | ICD-10-CM

## 2017-06-05 NOTE — Telephone Encounter (Signed)
Baby aspirin is likely just as cheap OTC. She should have a year's worth of Sinemet that was sent in on 05/19/17. She should call her cardiologist for [place].   OK to refill Sertraline. 90 w 1 ref.   Is the last medicine omeprazole?

## 2017-06-06 ENCOUNTER — Telehealth: Payer: Self-pay | Admitting: Pulmonary Disease

## 2017-06-06 DIAGNOSIS — G4733 Obstructive sleep apnea (adult) (pediatric): Secondary | ICD-10-CM | POA: Diagnosis not present

## 2017-06-06 MED ORDER — SERTRALINE HCL 100 MG PO TABS: 100.0000 mg | ORAL_TABLET | Freq: Every day | ORAL | 1 refills | Status: DC

## 2017-06-06 MED ORDER — OMEPRAZOLE 20 MG PO CPDR: 20.0000 mg | DELAYED_RELEASE_CAPSULE | Freq: Every day | ORAL | 0 refills | Status: DC

## 2017-06-06 MED ORDER — OMEPRAZOLE 20 MG PO CPDR: 20.0000 mg | DELAYED_RELEASE_CAPSULE | Freq: Every day | ORAL | 1 refills | Status: DC

## 2017-06-06 MED ORDER — SERTRALINE HCL 100 MG PO TABS: 100.0000 mg | ORAL_TABLET | Freq: Every day | ORAL | 0 refills | Status: DC

## 2017-06-06 NOTE — Telephone Encounter (Signed)
Pt's daughter, Emerson [facility]) is aware of results. Emerson Monte states she will discuss results with pt and will call us back with decision.

## 2017-06-06 NOTE — Telephone Encounter (Signed)
They just need to call and ask, I don't think they would need an appt. If that's an issue, OK to give 30 w no refills for more time.   Omeprazole 20 mg daily, disp 90 ref 1. TY.

## 2017-06-06 NOTE — Telephone Encounter (Signed)
  Sorry, she had a rough night PSG showed mod OSA - AHI 20/h Leg movements were controlled  SUggest autoCPAP if she is willing to proceed

## 2017-06-06 NOTE — Telephone Encounter (Signed)
Sent [facility] Informed the patients daughter of PCP instructions regarding medication

## 2017-06-06 NOTE — Procedures (Signed)
Patient Name: Dawn Stewart, Dawn Stewart Date: 06/05/2017 Gender: Female D.O.B: 06/05/1941 Age (years): 72 Referring Provider: Kara Mead MD, ABSM Height (inches): 63 Interpreting Physician: Kara Mead MD, ABSM Weight (lbs): 138 RPSGT: Baxter Flattery BMI: 24 MRN: [id number] Neck Size: 14.50   CLINICAL INFORMATION Sleep Study Type: NPSG  Indication for sleep study: Diabetes, Excessive Daytime Sleepiness, Fatigue, OSA, Snoring, Witnessed Apneas  Epworth Sleepiness Score:12  SLEEP STUDY TECHNIQUE As per the AASM Manual for the Scoring of Sleep and Associated Events v2.3 (April 2016) with a hypopnea requiring 4% desaturations.  The channels recorded and monitored were frontal, central and occipital EEG, electrooculogram (EOG), submentalis EMG (chin), nasal and oral airflow, thoracic and abdominal wall motion, anterior tibialis EMG, snore microphone, electrocardiogram, and pulse oximetry.  MEDICATIONS Medications self-administered by patient taken the night of the study : [place] The study was initiated at 10:12:31 PM and ended at 4:12:08 AM.  Sleep onset time was 24.4 minutes and the sleep efficiency was 53.1%. The total sleep time was 191.0 minutes.  Stage REM latency was 222.0 minutes.  The patient spent 8.90% of the night in stage N1 sleep, 77.75% in stage N2 sleep, 0.00% in stage N3 and 13.35% in REM.  Alpha intrusion was absent.  Supine sleep was 0.00%.  RESPIRATORY PARAMETERS The overall apnea/hypopnea index (AHI) was 20.7 per hour. There were 66 total apneas, including 66 obstructive, 0 central and 0 mixed apneas. There were 0 hypopneas and 0 RERAs.  The AHI during Stage REM sleep was 16.5 per hour.  AHI while supine was N/A per hour.  The mean oxygen saturation was 90.00%. The minimum SpO2 during sleep was 87.00%.  Moderate snoring was noted during this study.  CARDIAC DATA The 2 lead EKG demonstrated sinus rhythm. The mean heart rate was 41.25  beats per minute. Other EKG findings include: None.   LEG MOVEMENT DATA The total PLMS were 21 with a resulting PLMS index of 6.60. Associated arousal with leg movement index was 0.0 .  IMPRESSIONS - Moderate obstructive sleep apnea occurred during this study (AHI = 20.7/h). - No significant central sleep apnea occurred during this study (CAI = 0.0/h). - Moderate oxygen desaturation was noted during this study (Min O2 = 87.00%). - The patient snored with Moderate snoring volume. - No cardiac abnormalities were noted during this study. - Mild periodic limb movements of sleep occurred during the study. No significant associated arousals.   DIAGNOSIS - Obstructive Sleep Apnea (327.23 [G47.33 ICD-10]) - Nocturnal Hypoxemia (327.26 [G47.36 ICD-10])   RECOMMENDATIONS - Therapeutic CPAP titration to determine optimal pressure required to alleviate sleep disordered breathing. - Avoid alcohol, sedatives and other CNS depressants that may worsen sleep apnea and disrupt normal sleep architecture. - Sleep hygiene should be reviewed to assess factors that may improve sleep quality. - Weight management and regular exercise should be initiated or continued if appropriate.   Kara Mead MD Board Certified in [place]

## 2017-06-06 NOTE — Telephone Encounter (Signed)
What mg of omeprazole? The daughter is over whelmed with the care of her mom/son.  I did inform her you  Need her to go to the cardiolgist for the refill on Brilinta--she wanted me to ask again if you would refill as she does not want to chase down the cardiologsit

## 2017-06-09 ENCOUNTER — Other Ambulatory Visit: Payer: Self-pay | Admitting: Family Medicine

## 2017-06-16 ENCOUNTER — Ambulatory Visit: Payer: Medicare Other | Admitting: Adult Health

## 2017-06-23 ENCOUNTER — Telehealth: Payer: Self-pay | Admitting: *Deleted

## 2017-06-23 DIAGNOSIS — E119 Type 2 diabetes mellitus without complications: Secondary | ICD-10-CM

## 2017-06-23 DIAGNOSIS — D649 Anemia, unspecified: Secondary | ICD-10-CM

## 2017-06-23 NOTE — Addendum Note (Signed)
Addended by: Harl Bowie on: 06/23/2017 12:04 PM   Modules accepted: Orders

## 2017-06-23 NOTE — Telephone Encounter (Signed)
Called and spoke with the pt's daughter Althea Grimmer) and informed her of the pt's recent lab results and note.  She verbalized understanding and agreed.  She stated that she will try to get the pt's last Colonoscopy results.  Pt was scheduled for lab appt for (Fri-06/24/17 @ 1:45pm).  Future labs ordered and sent.//AB/CMA

## 2017-06-23 NOTE — Telephone Encounter (Signed)
-----   Message from [name], DO sent at 06/14/2017  2:11 PM EDT ----- Normocytic anemia. Will obtain reticulocyte count, peripheral (save) smear, folate, B12 and FOBT for completeness. The Fe studies were already obtained. A1c appropriate for her age, hopefully not falsely lowered with decreased Hb. Will not change regimen. Stay on high dose statin. Does not appear that the urine was obtained, can get in future.   AB- Let pt know her diabetes is in good shape. Her cholesterol is a little high, stay active and keep taking Lipitor (atorvastatin). Her blood count is a little lower. I would like to get some follow up labs on this in the next couple weeks at her convenience. Let's get the results of her colonoscopy as well if possible. Please place the above orders in the second sentence for dx "normocytic anemia" and schedule her at earliest convenience. Her urine did not run either, so if she comes ready to leave [place] a sample, that would be ideal. TY.

## 2017-06-24 ENCOUNTER — Other Ambulatory Visit (INDEPENDENT_AMBULATORY_CARE_PROVIDER_SITE_OTHER): Payer: Medicare Other

## 2017-06-24 DIAGNOSIS — E119 Type 2 diabetes mellitus without complications: Secondary | ICD-10-CM

## 2017-06-24 DIAGNOSIS — D649 Anemia, unspecified: Secondary | ICD-10-CM | POA: Diagnosis not present

## 2017-06-24 LAB — CBC WITH DIFFERENTIAL/PLATELET
BASOS ABS: 0 10*3/uL (ref 0.0–0.1)
BASOS PCT: 0.2 % (ref 0.0–3.0)
EOS PCT: 10.9 % — AB (ref 0.0–5.0)
Eosinophils Absolute: 0.9 10*3/uL — ABNORMAL HIGH (ref 0.0–0.7)
HEMATOCRIT: 38 % (ref 36.0–46.0)
Hemoglobin: 12.3 g/dL (ref 12.0–15.0)
LYMPHS PCT: 33.8 % (ref 12.0–46.0)
Lymphs Abs: 2.7 10*3/uL (ref 0.7–4.0)
MCHC: 32.4 g/dL (ref 30.0–36.0)
MCV: 84.6 fl (ref 78.0–100.0)
MONOS PCT: 5.2 % (ref 3.0–12.0)
Monocytes Absolute: 0.4 10*3/uL (ref 0.1–1.0)
NEUTROS ABS: 3.9 10*3/uL (ref 1.4–7.7)
Neutrophils Relative %: 49.9 % (ref 43.0–77.0)
Platelets: 197 10*3/uL (ref 150.0–400.0)
RBC: 4.49 Mil/uL (ref 3.87–5.11)
RDW: 16.9 % — AB (ref 11.5–15.5)
WBC: 7.9 10*3/uL (ref 4.0–10.5)

## 2017-06-24 LAB — RETICULOCYTES
ABS Retic: 49060 cells/uL (ref 20000–80000)
RBC.: 4.46 MIL/uL (ref 3.80–5.10)
Retic Ct Pct: 1.1 %

## 2017-06-25 LAB — MICROALBUMIN / CREATININE URINE RATIO
Creatinine, Urine: 152 mg/dL (ref 20–320)
Microalb Creat Ratio: 7 ug/mg{creat} (ref <30)
Microalb, Ur: 1 mg/dL

## 2017-06-25 IMAGING — CR DG CHEST 2V
2 series · 2 of 2 positions shown · non-contrast
Comparison: None.

CLINICAL DATA: Chest pain.  Hypertension.  Shortness of breath.

EXAM:
CHEST  2 VIEW

[w chest pa]
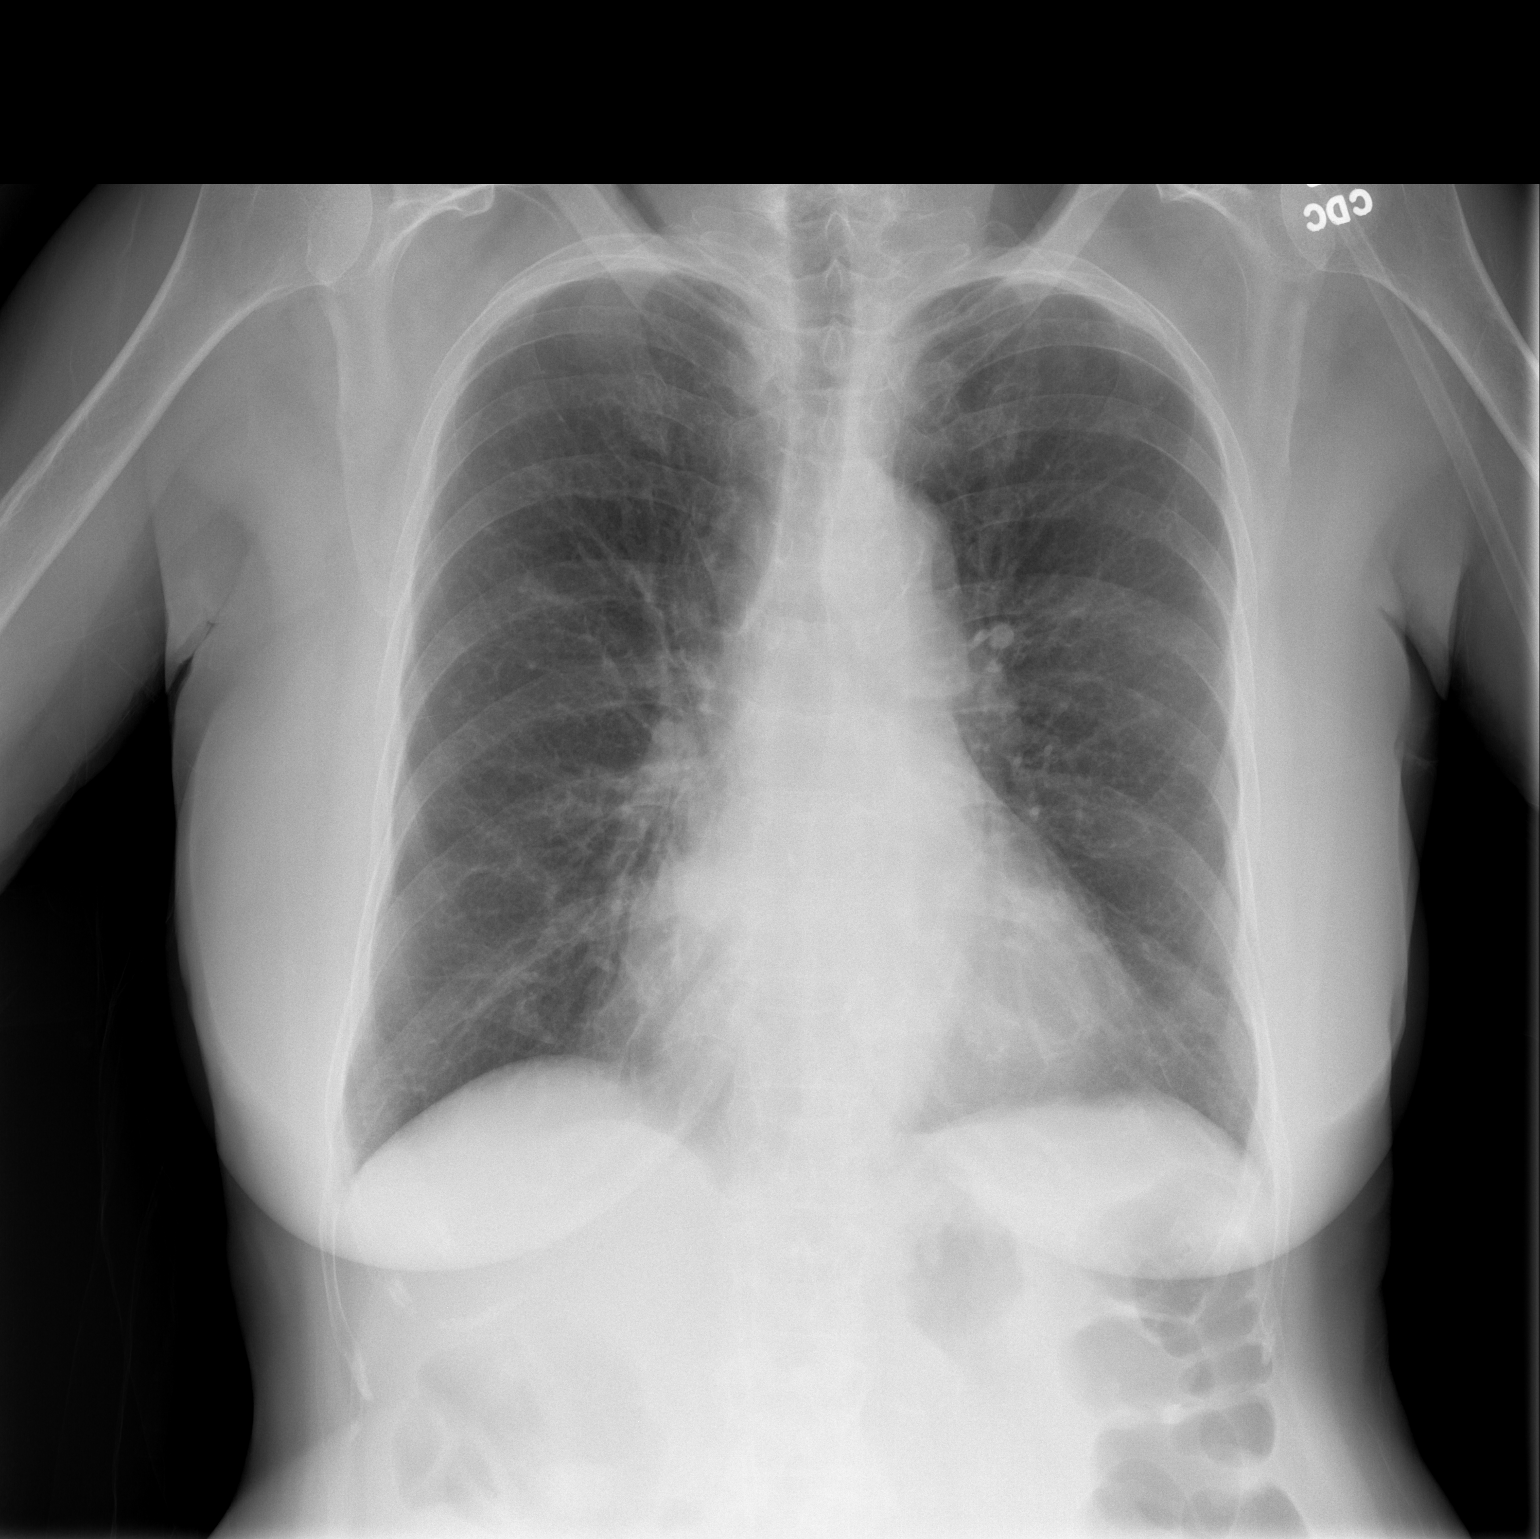

[w chest lat]
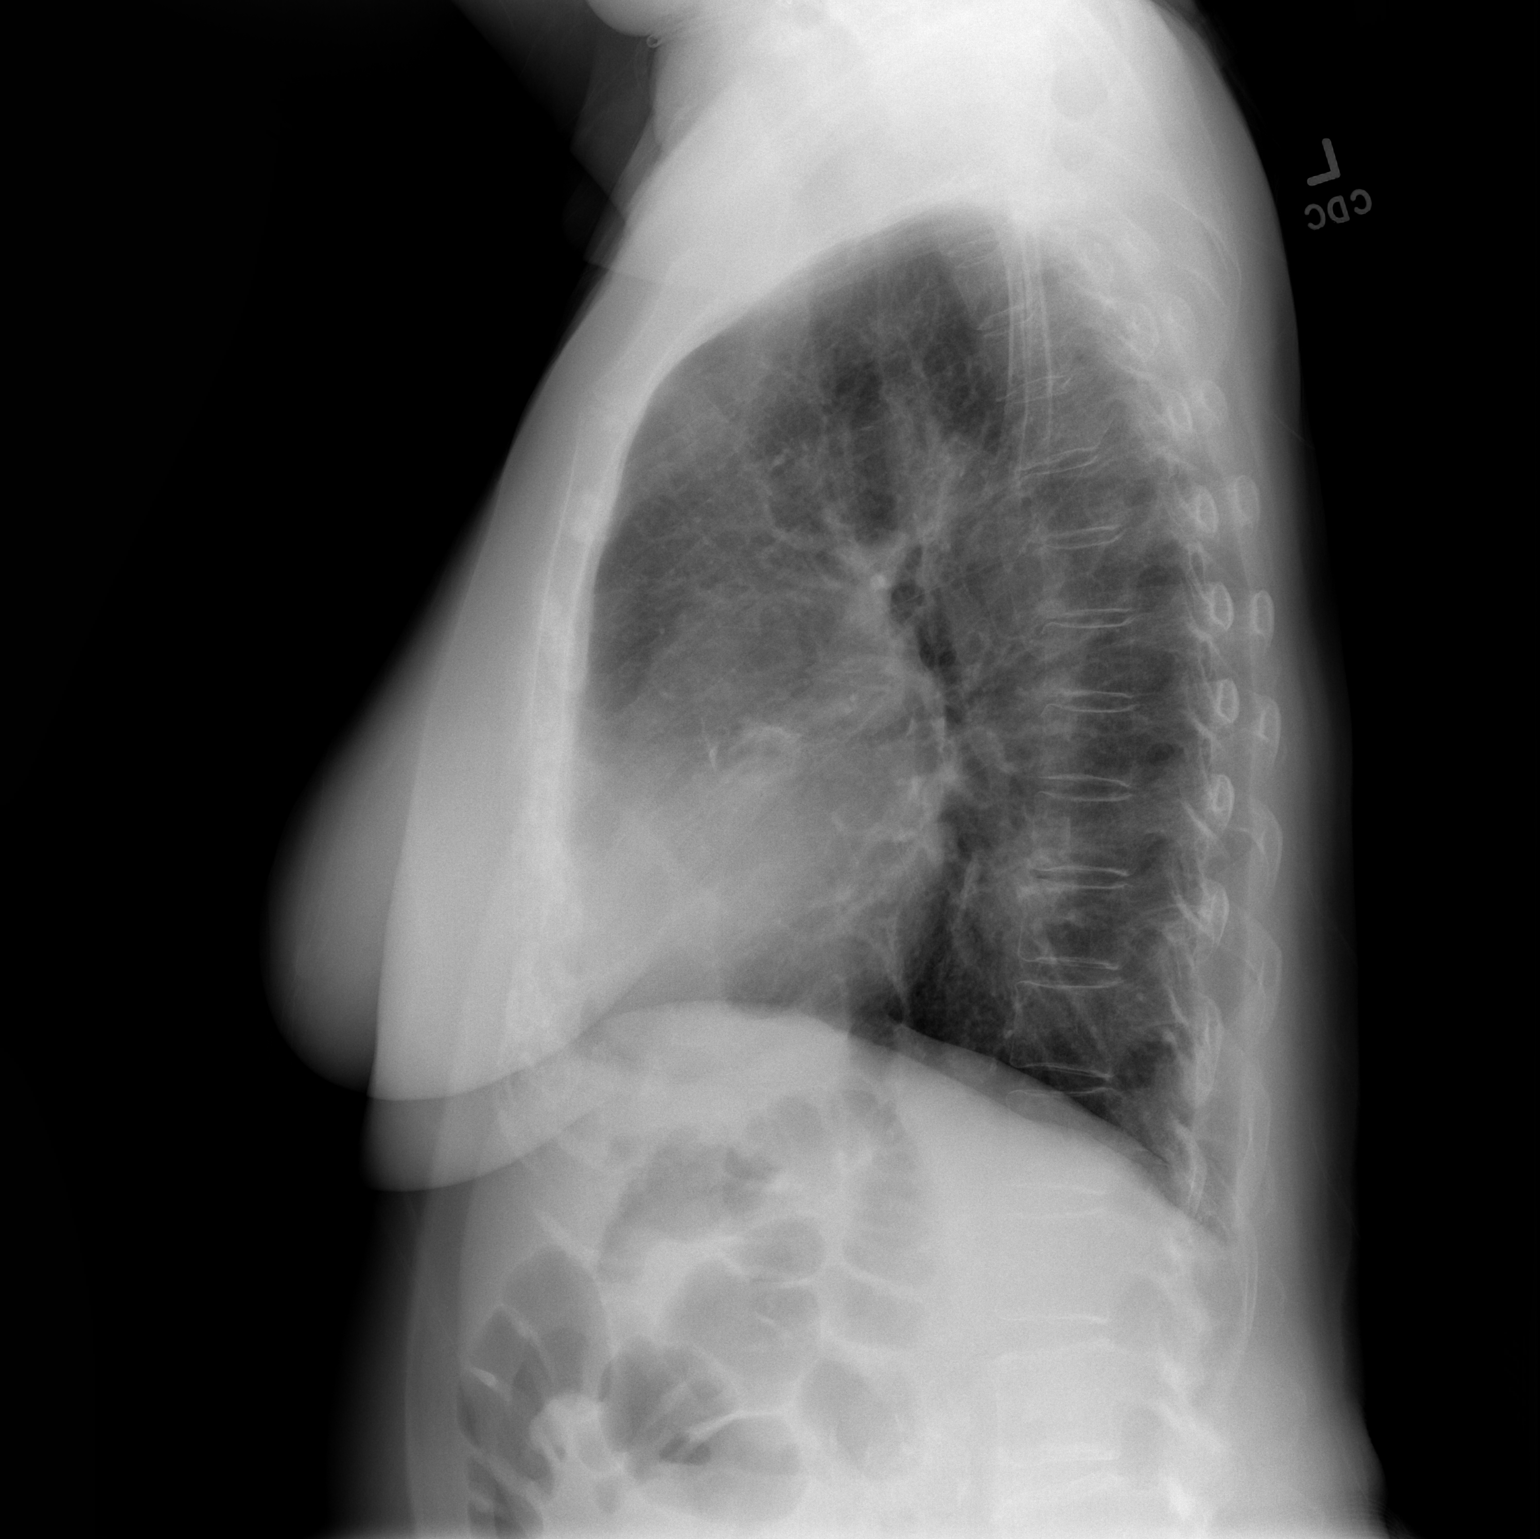

[2 of 2 positions shown; findings below may reference images not displayed]

FINDINGS: Heart size is normal. There is aortic atherosclerosis. The left lung
is clear. I think there is mild bronchopneumonia in the right lower
lobe with air bronchograms. No effusions. No bone abnormality.
IMPRESSION: Suspicion of right lower lobe pneumonia with air bronchograms.

## 2017-06-27 ENCOUNTER — Other Ambulatory Visit: Payer: Self-pay | Admitting: Family Medicine

## 2017-06-27 NOTE — Telephone Encounter (Signed)
Rx's approved and sent to the pharmacy by [facility].//AB/CMA 

## 2017-06-30 ENCOUNTER — Telehealth: Payer: Self-pay | Admitting: Pulmonary Disease

## 2017-06-30 NOTE — Telephone Encounter (Signed)
lmtcb x1 for pt's daughter. 

## 2017-07-01 NOTE — Telephone Encounter (Signed)
lmtcb x2 for pt's daughter. 

## 2017-07-04 ENCOUNTER — Telehealth: Payer: Self-pay | Admitting: Pulmonary Disease

## 2017-07-04 NOTE — Telephone Encounter (Signed)
LMOM TCB x3  Per triage protocol, will close off on message since we have ATC Renna 3 times with no success Left detailed message on named VM informing Renna of the protocol and that a new message will be generated when she returns our call and we will be happy to get patient taken care of  Will sign off

## 2017-07-04 NOTE — Telephone Encounter (Signed)
LM for Renna x 1

## 2017-07-05 NOTE — Telephone Encounter (Signed)
lmomtcb x 2 for Renna

## 2017-07-06 NOTE — Telephone Encounter (Signed)
lmtcb x3 for pt's daughter, Dawn Stewart.

## 2017-07-07 NOTE — Telephone Encounter (Signed)
We have attempted to contact pt's daughter several times with no success or call back. Per triage protocol, message will be closed.  

## 2017-07-08 ENCOUNTER — Other Ambulatory Visit (INDEPENDENT_AMBULATORY_CARE_PROVIDER_SITE_OTHER): Payer: Medicare Other

## 2017-07-08 DIAGNOSIS — D649 Anemia, unspecified: Secondary | ICD-10-CM | POA: Diagnosis not present

## 2017-07-08 LAB — FECAL OCCULT BLOOD, IMMUNOCHEMICAL: Fecal Occult Bld: NEGATIVE

## 2017-07-21 DIAGNOSIS — H353211 Exudative age-related macular degeneration, right eye, with active choroidal neovascularization: Secondary | ICD-10-CM | POA: Diagnosis not present

## 2017-07-28 ENCOUNTER — Ambulatory Visit (INDEPENDENT_AMBULATORY_CARE_PROVIDER_SITE_OTHER): Payer: Medicare Other | Admitting: Family Medicine

## 2017-07-28 ENCOUNTER — Encounter: Payer: Self-pay | Admitting: Family Medicine

## 2017-07-28 VITALS — BP 128/70 | HR 72 | Temp 98.6°F | Ht 63.0 in | Wt 132.2 lb

## 2017-07-28 DIAGNOSIS — F4310 Post-traumatic stress disorder, unspecified: Secondary | ICD-10-CM | POA: Diagnosis not present

## 2017-07-28 DIAGNOSIS — G2581 Restless legs syndrome: Secondary | ICD-10-CM

## 2017-07-28 DIAGNOSIS — W57XXXA Bitten or stung by nonvenomous insect and other nonvenomous arthropods, initial encounter: Secondary | ICD-10-CM

## 2017-07-28 MED ORDER — CARBIDOPA-LEVODOPA 25-100 MG PO TABS: 2.0000 | ORAL_TABLET | Freq: Every day | ORAL | 3 refills | Status: DC

## 2017-07-28 MED ORDER — CARBIDOPA-LEVODOPA 25-100 MG PO TABS: 2.0000 | ORAL_TABLET | Freq: Every day | ORAL | 0 refills | Status: DC

## 2017-07-28 MED ORDER — CLONAZEPAM 0.5 MG PO TABS: 0.5000 mg | ORAL_TABLET | Freq: Every day | ORAL | 1 refills | Status: DC | PRN

## 2017-07-28 NOTE — Progress Notes (Signed)
Pre visit review using our clinic review tool, if applicable. No additional management support is needed unless otherwise documented below in the visit note. 

## 2017-07-28 NOTE — Patient Instructions (Addendum)
Things to look out for: increasing pain not relieved by ibuprofen/acetaminophen, fevers, spreading redness, drainage of pus, or foul odor.  OK to continue Neosporin, no need to put alcohol on it.   Keep it clean and dry. It looks like it is healing well.   Please consider counseling. The medical literature and evidence-based guidelines support it. Contact [phone] to schedule an appointment or inquire about cost/insurance coverage.  Let us know if you need anything.

## 2017-07-28 NOTE — Progress Notes (Signed)
Chief Complaint  Patient presents with  . Insect Bite    Dawn Stewart is a 76 y.o. female here for a skin complaint.  Bit by insect a few weeks ago and it is healing.   She needs some refills on Sinemet that her previous pcp and was initially started on by Neurology. She actually takes 2 tabs nightly rather than 1 tab and it controls RLS symptoms. She is tolerating medicine well. She also has a history of panic attacks stemming from PTSD. She takes Klonopin as needed. It has been more frequent as of late, however typically will not take more than one per week.   ROS:  Const: No fevers Skin: As noted in HPI  Past Medical History:  Diagnosis Date  . Asthma   . COPD (chronic obstructive pulmonary disease) ([place])   . Diabetes mellitus without complication ([place])   . Essential hypertension 05/27/2017  . Hypertension   . Post traumatic stress disorder (PTSD)    Allergies  Allergen Reactions  . Ivp Dye [Iodinated Diagnostic Agents]     unknown  . Tape Other (See Comments)    Pt. reports that it gives her blisters.   Allergies as of 07/28/2017      Reactions   Ivp Dye [iodinated Diagnostic Agents]    unknown   Tape Other (See Comments)   Pt. reports that it gives her blisters.      Medication List       Accurate as of 07/28/17  4:46 PM. Always use your most recent med list.          albuterol 108 (90 Base) MCG/ACT inhaler Commonly known as:  PROVENTIL HFA;VENTOLIN HFA Inhale 2 puffs into the lungs every 6 (six) hours as needed for wheezing or shortness of breath.   aspirin 81 MG chewable tablet Chew 81 mg by mouth daily.   atorvastatin 80 MG tablet Commonly known as:  LIPITOR Take 1 tablet by mouth daily.   calcium carbonate 600 MG Tabs tablet Commonly known as:  OS-CAL Take 600 mg by mouth 2 (two) times daily with a meal.   carbidopa-levodopa 25-100 MG tablet Commonly known as:  SINEMET IR Take 2 tablets by mouth at bedtime.   clonazePAM 0.5 MG tablet Commonly  known as:  KLONOPIN Take 1 tablet (0.5 mg total) by mouth daily as needed for anxiety.   fexofenadine 180 MG tablet Commonly known as:  ALLEGRA Take 180 mg by mouth daily.   Fluticasone-Umeclidin-Vilant 100-62.5-25 MCG/INH Aepb Commonly known as:  TRELEGY ELLIPTA Inhale 1 puff into the lungs daily.   HYDROcodone-acetaminophen 5-325 MG tablet Commonly known as:  NORCO/VICODIN Take 1-2 tablets by mouth every 6 (six) hours as needed for moderate pain.   methocarbamol 500 MG tablet Commonly known as:  ROBAXIN TK 1 AND 1/2 TS PO Q 4 H PRN   metoprolol tartrate 25 MG tablet Commonly known as:  LOPRESSOR TAKE 1 TABLET TWICE A DAY   nitroGLYCERIN 0.4 MG SL tablet Commonly known as:  NITROSTAT Place 0.4 mg under the tongue as needed.   omeprazole 40 MG capsule Commonly known as:  PRILOSEC Take 40 mg by mouth.   omeprazole 20 MG capsule Commonly known as:  PRILOSEC Take 1 capsule (20 mg total) by mouth daily.   rOPINIRole 1 MG tablet Commonly known as:  REQUIP 1 hour before bedtime   sertraline 100 MG tablet Commonly known as:  ZOLOFT Take 1 tablet (100 mg total) by mouth daily.   sitaGLIPtin-metformin 50-1000  MG tablet Commonly known as:  JANUMET Take 1 tablet by mouth 2 (two) times daily with a meal.   tiotropium 18 MCG inhalation capsule Commonly known as:  [place] into inhaler and inhale. Place 65mcg into inhaler and inhale daily as needed.   valsartan-hydrochlorothiazide 320-12.5 MG tablet Commonly known as:  DIOVAN-HCT TAKE 1 TABLET DAILY   Vitamin D3 2000 units Tabs Take by mouth daily.            Discharge Care Instructions        Start     Ordered   07/28/17 0000  carbidopa-levodopa (SINEMET IR) 25-100 MG tablet  Daily at bedtime     07/28/17 1507   07/28/17 0000  clonazePAM (KLONOPIN) 0.5 MG tablet  Daily PRN     07/28/17 1514      BP 128/70 (BP Location: Left Arm, Patient Position: Sitting, Cuff Size: Normal)   Pulse 72   Temp 98.6  F (37 C) (Oral)   Ht 5\' 3"  (1.6 m)   Wt 132 lb 3 oz (60 kg)   SpO2 98%   BMI 23.42 kg/m  Gen: awake, alert, appearing stated age Lungs: No accessory muscle use Skin: There are 3 small erythematous and excoriated lesions on the underside of her L breast (she was examined in the presence of a female colleague, Idelle Crouch, RN). No drainage, erythema, TTP, fluctuance, excoriation Psych: Age appropriate judgment and insight  Insect bite, initial encounter  RLS (restless legs syndrome) - Plan: carbidopa-levodopa (SINEMET IR) 25-100 MG tablet, DISCONTINUED: carbidopa-levodopa (SINEMET IR) 25-100 MG tablet  PTSD (post-traumatic stress disorder) - Plan: clonazePAM (KLONOPIN) 0.5 MG tablet  Orders as above. OK to refill, will check with pharm team to make sure this is OK.  Klonopin refilled, it does not appear she is using much. Gave contact info for counseling. She is interested and open to doing this. I think this will help with coping.  F/u in 2 mo. The patient voiced understanding and agreement to the plan.  [place], DO 07/28/17 4:46 PM

## 2017-08-10 DIAGNOSIS — I714 Abdominal aortic aneurysm, without rupture: Secondary | ICD-10-CM | POA: Diagnosis not present

## 2017-08-10 DIAGNOSIS — I6523 Occlusion and stenosis of bilateral carotid arteries: Secondary | ICD-10-CM | POA: Diagnosis not present

## 2017-08-10 DIAGNOSIS — I700 Atherosclerosis of aorta: Secondary | ICD-10-CM | POA: Diagnosis not present

## 2017-08-10 DIAGNOSIS — K551 Chronic vascular disorders of intestine: Secondary | ICD-10-CM | POA: Diagnosis not present

## 2017-08-10 DIAGNOSIS — Z95820 Peripheral vascular angioplasty status with implants and grafts: Secondary | ICD-10-CM | POA: Diagnosis not present

## 2017-08-10 DIAGNOSIS — I70213 Atherosclerosis of native arteries of extremities with intermittent claudication, bilateral legs: Secondary | ICD-10-CM | POA: Diagnosis not present

## 2017-08-15 ENCOUNTER — Ambulatory Visit (INDEPENDENT_AMBULATORY_CARE_PROVIDER_SITE_OTHER): Payer: Medicare Other | Admitting: Psychology

## 2017-08-15 DIAGNOSIS — F331 Major depressive disorder, recurrent, moderate: Secondary | ICD-10-CM

## 2017-08-18 ENCOUNTER — Ambulatory Visit (INDEPENDENT_AMBULATORY_CARE_PROVIDER_SITE_OTHER): Payer: Medicare Other | Admitting: Pulmonary Disease

## 2017-08-18 ENCOUNTER — Encounter: Payer: Self-pay | Admitting: Pulmonary Disease

## 2017-08-18 DIAGNOSIS — J449 Chronic obstructive pulmonary disease, unspecified: Secondary | ICD-10-CM | POA: Diagnosis not present

## 2017-08-18 DIAGNOSIS — G2581 Restless legs syndrome: Secondary | ICD-10-CM

## 2017-08-18 DIAGNOSIS — G4733 Obstructive sleep apnea (adult) (pediatric): Secondary | ICD-10-CM

## 2017-08-18 NOTE — Assessment & Plan Note (Signed)
Place an order for auto CPAP 5-10 cm with nasal pillows, download in 6 weeks   compliance with goal of at least 4-6 hrs every night is the expectation. Advised against medications with sedative side effects Cautioned against driving when sleepy - understanding that sleepiness will vary on a day to day basis

## 2017-08-18 NOTE — Patient Instructions (Signed)
We will set you up with CPAP machine to use during sleep STOP taking spiriva Stay on trelegy once daily

## 2017-08-18 NOTE — Assessment & Plan Note (Signed)
STOP taking spiriva Stay on trelegy once daily  She would benefit from pulmonary rehabilitation ,does not want to pursue at this time

## 2017-08-18 NOTE — Progress Notes (Signed)
   Subjective:    Patient ID: Dawn Stewart, female    DOB: August 13, 1941, 76 y.o.   MRN: [id number]  HPI  77 yo female former smoker For follow-up of COPD on home O2, OSA & restless legs She was told that she had COPD and was placed on oxygen more than 10 years ago which she uses at night  On her last visit, use of trelegy was reemphasized. Now her medication review shows both this medication as well as Spiriva. She seems to be confused regarding her medications but fortunately has a list. It seems that Requip has been stopped Since her last office visit, she had MI and required DES to the left circumflex-she is now also on Brilinta I reviewed her sleep study with her which showed moderate OSA and surprisingly not significant PLms     Significant tests/ events reviewed Spirometry-FEV1 0.72, 35%, ratio 63 06/2017 PSG >> AHI 20/h, few PLms    Review of Systems neg for any significant sore throat, dysphagia, itching, sneezing, nasal congestion or excess/ purulent secretions, fever, chills, sweats, unintended wt loss, pleuritic or exertional cp, hempoptysis, orthopnea pnd or change in chronic leg swelling. Also denies presyncope, palpitations, heartburn, abdominal pain, nausea, vomiting, diarrhea or change in bowel or urinary habits, dysuria,hematuria, rash, arthralgias, visual complaints, headache, numbness weakness or ataxia.     Objective:   Physical Exam  Gen. Pleasant, well-nourished, elderly,in no distress, anxious affect ENT - no thrush, no post nasal drip Neck: No JVD, no thyromegaly, no carotid bruits Lungs: no use of accessory muscles, no dullness to percussion, clear without rales or rhonchi  Cardiovascular: Rhythm regular, heart sounds  normal, no murmurs or gallops, no peripheral edema Musculoskeletal: No deformities, no cyanosis or clubbing        Assessment & Plan:

## 2017-08-18 NOTE — Assessment & Plan Note (Signed)
PLMS did not appear to be significant No treatment at this time

## 2017-08-25 ENCOUNTER — Other Ambulatory Visit: Payer: Self-pay

## 2017-08-25 DIAGNOSIS — G4733 Obstructive sleep apnea (adult) (pediatric): Secondary | ICD-10-CM

## 2017-08-31 ENCOUNTER — Ambulatory Visit (INDEPENDENT_AMBULATORY_CARE_PROVIDER_SITE_OTHER): Payer: Medicare Other | Admitting: Psychology

## 2017-08-31 ENCOUNTER — Telehealth: Payer: Self-pay | Admitting: Family Medicine

## 2017-08-31 DIAGNOSIS — F331 Major depressive disorder, recurrent, moderate: Secondary | ICD-10-CM | POA: Diagnosis not present

## 2017-08-31 MED ORDER — SERTRALINE HCL 100 MG PO TABS: 100.0000 mg | ORAL_TABLET | Freq: Every day | ORAL | 0 refills | Status: DC

## 2017-08-31 NOTE — Telephone Encounter (Signed)
Refill done.  

## 2017-08-31 NOTE — Telephone Encounter (Signed)
°  Relation to QP:YPPJ Call back [place] for DOD - Vernia Buff, [place] [phone] ([phone] (Fax)     Reason for call:  Patient requesting 90 day supply sertraline (ZOLOFT) 100 MG tablet

## 2017-09-08 ENCOUNTER — Telehealth: Payer: Self-pay | Admitting: Family Medicine

## 2017-09-08 DIAGNOSIS — G2581 Restless legs syndrome: Secondary | ICD-10-CM

## 2017-09-08 MED ORDER — CARBIDOPA-LEVODOPA 25-100 MG PO TABS: 2.0000 | ORAL_TABLET | Freq: Every day | ORAL | 0 refills | Status: DC

## 2017-09-08 NOTE — Telephone Encounter (Signed)
Pt request [place] [facility] to [facility] SINEMET IR 25-100MG .

## 2017-09-08 NOTE — Telephone Encounter (Signed)
Sent in refill

## 2017-09-27 DIAGNOSIS — H353211 Exudative age-related macular degeneration, right eye, with active choroidal neovascularization: Secondary | ICD-10-CM | POA: Diagnosis not present

## 2017-09-29 ENCOUNTER — Encounter: Payer: Self-pay | Admitting: Family Medicine

## 2017-09-29 ENCOUNTER — Ambulatory Visit (INDEPENDENT_AMBULATORY_CARE_PROVIDER_SITE_OTHER): Payer: Medicare Other | Admitting: Family Medicine

## 2017-09-29 VITALS — BP 140/64 | HR 76 | Temp 97.8°F | Ht 63.0 in | Wt 134.2 lb

## 2017-09-29 DIAGNOSIS — M5136 Other intervertebral disc degeneration, lumbar region: Secondary | ICD-10-CM

## 2017-09-29 DIAGNOSIS — M25512 Pain in left shoulder: Secondary | ICD-10-CM

## 2017-09-29 MED ORDER — METHYLPREDNISOLONE ACETATE 40 MG/ML IJ SUSP
40.0000 mg | Freq: Once | INTRAMUSCULAR | Status: AC
  Administered 2017-09-29: 40 mg via INTRAMUSCULAR

## 2017-09-29 MED ORDER — METHYLPREDNISOLONE 4 MG PO TBPK: ORAL_TABLET | ORAL | 0 refills | Status: DC

## 2017-09-29 NOTE — Patient Instructions (Addendum)
OK to take Tylenol 1000 mg (2 extra strength tabs) or 975 mg (3 regular strength tabs) every 6 hours as needed.  Heat (pad or rice pillow in microwave) over affected area, 10-15 minutes every 2-3 hours while awake.   EXERCISES  RANGE OF MOTION (ROM) AND STRETCHING EXERCISES These exercises may help you when beginning to rehabilitate your injury. Your symptoms may resolve with or without further involvement from your physician, physical therapist or athletic trainer. While completing these exercises, remember:   Restoring tissue flexibility helps normal motion to return to the joints. This allows healthier, less painful movement and activity.  An effective stretch should be held for at least 30 seconds.  A stretch should never be painful. You should only feel a gentle lengthening or release in the stretched tissue.  ROM - Pendulum  Bend at the waist so that your right / left arm falls away from your body. Support yourself with your opposite hand on a solid surface, such as a table or a countertop.  Your right / left arm should be perpendicular to the ground. If it is not perpendicular, you need to lean over farther. Relax the muscles in your right / left arm and shoulder as much as possible.  Gently sway your hips and trunk so they move your right / left arm without any use of your right / left shoulder muscles.  Progress your movements so that your right / left arm moves side to side, then forward and backward, and finally, both clockwise and counterclockwise.  Complete 10-15 repetitions in each direction. Many people use this exercise to relieve discomfort in their shoulder as well as to gain range of motion. Repeat 2-3 times. Complete this exercise once per day.  STRETCH - Flexion, Standing  Stand with good posture. With an underhand grip on your right / left hand and an overhand grip on the opposite hand, grasp a broomstick or cane so that your hands are a little more than  shoulder-width apart.  Keeping your right / left elbow straight and shoulder muscles relaxed, push the stick with your opposite hand to raise your right / left arm in front of your body and then overhead. Raise your arm until you feel a stretch in your right / left shoulder, but before you have increased shoulder pain.  Try to avoid shrugging your right / left shoulder as your arm rises by keeping your shoulder blade tucked down and toward your mid-back spine. Hold 15-20 seconds.  Slowly return to the starting position. Repeat 2-3 times. Complete this exercise once per day.  STRETCH - Internal Rotation  Place your right / left hand behind your back, palm-up.  Throw a towel or belt over your opposite shoulder. Grasp the towel/belt with your right / left hand.  While keeping an upright posture, gently pull up on the towel/belt until you feel a stretch in the front of your right / left shoulder.  Avoid shrugging your right / left shoulder as your arm rises by keeping your shoulder blade tucked down and toward your mid-back spine.  Hold 15-20. Release the stretch by lowering your opposite hand. Repeat 2-3 times. Complete this exercise once per day.  STRETCH - External Rotation and Abduction  Stagger your stance through a doorframe. It does not matter which foot is forward.  As instructed by your physician, physical therapist or athletic trainer, place your hands: ? And forearms above your head and on the door frame. ? And forearms at head-height  and on the door frame. ? At elbow-height and on the door frame.  Keeping your head and chest upright and your stomach muscles tight to prevent over-extending your low-back, slowly shift your weight onto your front foot until you feel a stretch across your chest and/or in the front of your shoulders.  Hold 15-20 seconds. Shift your weight to your back foot to release the stretch. Repeat 2-3 times. Complete this stretch once per day.    STRENGTHENING EXERCISES  These exercises may help you when beginning to rehabilitate your injury. They may resolve your symptoms with or without further involvement from your physician, physical therapist or athletic trainer. While completing these exercises, remember:   Muscles can gain both the endurance and the strength needed for everyday activities through controlled exercises.  Complete these exercises as instructed by your physician, physical therapist or athletic trainer. Progress the resistance and repetitions only as guided.  You may experience muscle soreness or fatigue, but the pain or discomfort you are trying to eliminate should never worsen during these exercises. If this pain does worsen, stop and make certain you are following the directions exactly. If the pain is still present after adjustments, discontinue the exercise until you can discuss the trouble with your clinician.  If advised by your physician, during your recovery, avoid activity or exercises which involve actions that place your right / left hand or elbow above your head or behind your back or head. These positions stress the tissues which are trying to heal.  STRENGTH - Scapular Depression and Adduction  With good posture, sit on a firm chair. Supported your arms in front of you with pillows, arm rests or a table top. Have your elbows in line with the sides of your body.  Gently draw your shoulder blades down and toward your mid-back spine. Gradually increase the tension without tensing the muscles along the top of your shoulders and the back of your neck.  Hold for 5 seconds. Slowly release the tension and relax your muscles completely before completing the next repetition.  After you have practiced this exercise, remove the arm support and complete it in standing as well as sitting. Repeat 2 times. Complete this exercise every other day.   STRENGTH - External Rotators  Secure a rubber exercise band/tubing  to a fixed object so that it is at the same height as your right / left elbow when you are standing or sitting on a firm surface.  Stand or sit so that the secured exercise band/tubing is at your side that is not injured.  Bend your elbow 90 degrees. Place a folded towel or small pillow under your right / left arm so that your elbow is a few inches away from your side.  Keeping the tension on the exercise band/tubing, pull it away from your body, as if pivoting on your elbow. Be sure to keep your body steady so that the movement is only coming from your shoulder rotating.  Hold 5 seconds. Release the tension in a controlled manner as you return to the starting position. Repeat 2 times. Complete this exercise every other day.   STRENGTH - Supraspinatus  Stand or sit with good posture. Grasp a 2-3 lb weight or an exercise band/tubing so that your hand is "thumbs-up," like when you shake hands.  Slowly lift your right / left hand from your thigh into the air, traveling about 30 degrees from straight out at your side. Lift your hand to shoulder height or  as far as you can without increasing any shoulder pain. Initially, many people do not lift their hands above shoulder height.  Avoid shrugging your right / left shoulder as your arm rises by keeping your shoulder blade tucked down and toward your mid-back spine.  Hold for 4-5 seconds. Control the descent of your hand as you slowly return to your starting position. Repeat 2 times. Complete this exercise every other day.   STRENGTH - Shoulder Extensors  Secure a rubber exercise band/tubing so that it is at the height of your shoulders when you are either standing or sitting on a firm arm-less chair.  With a thumbs-up grip, grasp an end of the band/tubing in each hand. Straighten your elbows and lift your hands straight in front of you at shoulder height. Step back away from the secured end of band/tubing until it becomes tense.  Squeezing your  shoulder blades together, pull your hands down to the sides of your thighs. Do not allow your hands to go behind you.  Hold for 5 seconds. Slowly ease the tension on the band/tubing as you reverse the directions and return to the starting position. Repeat 2 times. Complete this exercise every other day.   STRENGTH - Scapular Retractors  Secure a rubber exercise band/tubing so that it is at the height of your shoulders when you are either standing or sitting on a firm arm-less chair.  With a palm-down grip, grasp an end of the band/tubing in each hand. Straighten your elbows and lift your hands straight in front of you at shoulder height. Step back away from the secured end of band/tubing until it becomes tense.  Squeezing your shoulder blades together, draw your elbows back as you bend them. Keep your upper arm lifted away from your body throughout the exercise.  Hold 5 seconds. Slowly ease the tension on the band/tubing as you reverse the directions and return to the starting position. Repeat 2 times. Complete this exercise every other day.  STRENGTH - Scapular Depressors  Find a sturdy chair without wheels, such as a from a dining room table.  Keeping your feet on the floor, lift your bottom from the seat and lock your elbows.  Keeping your elbows straight, allow gravity to pull your body weight down. Your shoulders will rise toward your ears.  Raise your body against gravity by drawing your shoulder blades down your back, shortening the distance between your shoulders and ears. Although your feet should always maintain contact with the floor, your feet should progressively support less body weight as you get stronger.  Hold 5 seconds. In a controlled and slow manner, lower your body weight to begin the next repetition. Repeat 2 times. Complete this exercise every other day.   This information is not intended to replace advice given to you by your health care provider. Make sure you  discuss any questions you have with your health care provider.  Document Released: 10/06/2005 Document Revised: 12/13/2014 Document Reviewed: 03/06/2009 Elsevier Interactive Patient Education [employer].

## 2017-09-29 NOTE — Progress Notes (Signed)
Musculoskeletal Exam  Patient: Dawn Stewart DOB: November 01, 1941  DOS: 09/29/2017  SUBJECTIVE:  Chief Complaint:   Chief Complaint  Patient presents with  . Arm Pain    left arm pain and weakness    Dawn Stewart is a 76 y.o.  female for evaluation and treatment of L arm pain.   Onset: Several weeks ago (does not remember exact date). No injury or change.  Location: On top of shoulder, radiating down L arm Character:  aching  Progression of issue:  is unchanged Associated symptoms: weakness, decreased ROM Treatment: to date has been: activity modication.   Neurovascular symptoms: no  ROS: Musculoskeletal/Extremities: +L shoulder pain Neurologic: no numbness, tingling no weakness   Past Medical History:  Diagnosis Date  . Asthma   . COPD (chronic obstructive pulmonary disease) ([place])   . Diabetes mellitus without complication ([place])   . Essential hypertension 05/27/2017  . Hypertension   . Post traumatic stress disorder (PTSD)    Past Surgical History:  Procedure Laterality Date  . ABDOMINAL HYSTERECTOMY    . APPENDECTOMY    . BRAIN SURGERY    . TONSILLECTOMY    . TOTAL HIP ARTHROPLASTY     Family History  Problem Relation Age of Onset  . Heart disease Mother   . Heart disease Father    Current Outpatient Prescriptions  Medication Sig Dispense Refill  . albuterol (PROVENTIL HFA;VENTOLIN HFA) 108 (90 Base) MCG/ACT inhaler Inhale 2 puffs into the lungs every 6 (six) hours as needed for wheezing or shortness of breath. 1 Inhaler 0  . aspirin 81 MG chewable tablet Chew 81 mg by mouth daily.    Marland Kitchen atorvastatin (LIPITOR) 80 MG tablet Take 1 tablet by mouth daily.  0  . calcium carbonate (OS-CAL) 600 MG TABS tablet Take 600 mg by mouth 2 (two) times daily with a meal.    . carbidopa-levodopa (SINEMET IR) 25-100 MG tablet Take 2 tablets by mouth at bedtime. 180 tablet 0  . Cholecalciferol (VITAMIN D3) 2000 units TABS Take by mouth daily.    . clonazePAM (KLONOPIN) 0.5 MG  tablet Take 1 tablet (0.5 mg total) by mouth daily as needed for anxiety. 30 tablet 1  . fexofenadine (ALLEGRA) 180 MG tablet Take 180 mg by mouth daily.    . Fluticasone-Umeclidin-Vilant (TRELEGY ELLIPTA) 100-62.5-25 MCG/INH AEPB Inhale 1 puff into the lungs daily. 3 each 3  . HYDROcodone-acetaminophen (NORCO/VICODIN) 5-325 MG tablet Take 1-2 tablets by mouth every 6 (six) hours as needed for moderate pain. 14 tablet 0  . methocarbamol (ROBAXIN) 500 MG tablet TK 1 AND 1/2 TS PO Q 4 H PRN  0  . metoprolol tartrate (LOPRESSOR) 25 MG tablet TAKE 1 TABLET TWICE A DAY 180 tablet 1  . nitroGLYCERIN (NITROSTAT) 0.4 MG SL tablet Place 0.4 mg under the tongue as needed.    Marland Kitchen omeprazole (PRILOSEC) 40 MG capsule Take 40 mg by mouth.    . sertraline (ZOLOFT) 100 MG tablet Take 1 tablet (100 mg total) by mouth daily. 90 tablet 0  . sitaGLIPtin-metformin (JANUMET) 50-1000 MG tablet Take 1 tablet by mouth 2 (two) times daily with a meal. 180 tablet 1  . valsartan-hydrochlorothiazide (DIOVAN-HCT) 320-12.5 MG tablet TAKE 1 TABLET DAILY 90 tablet 1   Allergies  Allergen Reactions  . Ivp Dye [Iodinated Diagnostic Agents]     unknown  . Tape Other (See Comments)    Pt. reports that it gives her blisters.   Social History   Social History  .  Marital status: Widowed   Social History Main Topics  . Smoking status: Former Smoker    Packs/day: 1.50    Years: 65.00  . Smokeless tobacco: Never Used     Comment: quit smoking 1.5 years ago  . Alcohol use No    Objective: VITAL SIGNS: BP 140/64 (BP Location: Left Arm, Patient Position: Sitting, Cuff Size: Normal)   Pulse 76   Temp 97.8 F (36.6 C) (Oral)   Ht 5\' 3"  (1.6 m)   Wt 134 lb 4 oz (60.9 kg)   SpO2 97%   BMI 23.78 kg/m  Constitutional: Well formed, well developed. No acute distress. Cardiovascular: Brisk cap refill Thorax & Lungs: No accessory muscle use Extremities: No clubbing. No cyanosis. No edema.  Skin: Warm. Dry. No erythema. No  rash.  Musculoskeletal: L shoulder   Normal active range of motion: no.   Normal passive range of motion: no Tenderness to palpation: no Deformity: no Ecchymosis: no Tests positive: Neer's, Empty can, Hawkins Tests negative: Cross Over, Obrien's, lift off, Speed's Grip strength adequate b/l Neurologic: Normal sensory function. No focal deficits noted. DTR's equal and symmetry in UE's. No clonus. Psychiatric: Normal mood. Age appropriate judgment and insight. Alert & oriented x 3.    Procedure Note; Shoulder joint injection, L Informed consent obtained. The area was palpated, an area was marked lateral and inferior to acromion, and cleaned with alcohol x1. Freeze spray used.  A 27-gauge needle, while aiming towards the coracoid process, was used to enter the joint posteriorally with ease. 40 mg of Depomedrol with 2 mL of 1% lidocaine was injected. The patient tolerated the procedure well. There were no complications noted.   Assessment:  Acute pain of left shoulder - Plan: methylPREDNISolone (MEDROL DOSEPAK) 4 MG TBPK tablet, PR DRAIN/INJECT LARGE JOINT/BURSA, methylPREDNISolone acetate (DEPO-MEDROL) injection 40 mg  DDD (degenerative disc disease), lumbar - Plan: Ambulatory referral to [facility]: Orders as above. Medrol dosepak sent to mail order pharm per pt request. There are transportation issues and she does not want to burden her daughter.  Home stretches/exercises. Heat. F/u in 4 weeks if no improvement. Home PT/nursing aide for DDD and home ADL's. The patient voiced understanding and agreement to the plan.   [place], DO 09/29/17  3:14 PM

## 2017-09-29 NOTE — Progress Notes (Signed)
Pre visit review using our clinic review tool, if applicable. No additional management support is needed unless otherwise documented below in the visit note. 

## 2017-10-02 DIAGNOSIS — G4733 Obstructive sleep apnea (adult) (pediatric): Secondary | ICD-10-CM | POA: Diagnosis not present

## 2017-10-02 DIAGNOSIS — I10 Essential (primary) hypertension: Secondary | ICD-10-CM | POA: Diagnosis not present

## 2017-10-02 DIAGNOSIS — E1151 Type 2 diabetes mellitus with diabetic peripheral angiopathy without gangrene: Secondary | ICD-10-CM | POA: Diagnosis not present

## 2017-10-02 DIAGNOSIS — Z9981 Dependence on supplemental oxygen: Secondary | ICD-10-CM | POA: Diagnosis not present

## 2017-10-02 DIAGNOSIS — F4310 Post-traumatic stress disorder, unspecified: Secondary | ICD-10-CM | POA: Diagnosis not present

## 2017-10-02 DIAGNOSIS — R296 Repeated falls: Secondary | ICD-10-CM | POA: Diagnosis not present

## 2017-10-02 DIAGNOSIS — M25512 Pain in left shoulder: Secondary | ICD-10-CM | POA: Diagnosis not present

## 2017-10-02 DIAGNOSIS — Z7984 Long term (current) use of oral hypoglycemic drugs: Secondary | ICD-10-CM | POA: Diagnosis not present

## 2017-10-02 DIAGNOSIS — M5126 Other intervertebral disc displacement, lumbar region: Secondary | ICD-10-CM | POA: Diagnosis not present

## 2017-10-02 DIAGNOSIS — J449 Chronic obstructive pulmonary disease, unspecified: Secondary | ICD-10-CM | POA: Diagnosis not present

## 2017-10-02 DIAGNOSIS — Z87891 Personal history of nicotine dependence: Secondary | ICD-10-CM | POA: Diagnosis not present

## 2017-10-05 DIAGNOSIS — R296 Repeated falls: Secondary | ICD-10-CM | POA: Diagnosis not present

## 2017-10-05 DIAGNOSIS — E1151 Type 2 diabetes mellitus with diabetic peripheral angiopathy without gangrene: Secondary | ICD-10-CM | POA: Diagnosis not present

## 2017-10-05 DIAGNOSIS — I10 Essential (primary) hypertension: Secondary | ICD-10-CM | POA: Diagnosis not present

## 2017-10-05 DIAGNOSIS — J449 Chronic obstructive pulmonary disease, unspecified: Secondary | ICD-10-CM | POA: Diagnosis not present

## 2017-10-05 DIAGNOSIS — M5126 Other intervertebral disc displacement, lumbar region: Secondary | ICD-10-CM | POA: Diagnosis not present

## 2017-10-05 DIAGNOSIS — M25512 Pain in left shoulder: Secondary | ICD-10-CM | POA: Diagnosis not present

## 2017-10-10 ENCOUNTER — Telehealth: Payer: Self-pay | Admitting: Family Medicine

## 2017-10-10 DIAGNOSIS — M5126 Other intervertebral disc displacement, lumbar region: Secondary | ICD-10-CM | POA: Diagnosis not present

## 2017-10-10 DIAGNOSIS — I10 Essential (primary) hypertension: Secondary | ICD-10-CM | POA: Diagnosis not present

## 2017-10-10 DIAGNOSIS — J449 Chronic obstructive pulmonary disease, unspecified: Secondary | ICD-10-CM | POA: Diagnosis not present

## 2017-10-10 DIAGNOSIS — R296 Repeated falls: Secondary | ICD-10-CM | POA: Diagnosis not present

## 2017-10-10 DIAGNOSIS — M25512 Pain in left shoulder: Secondary | ICD-10-CM | POA: Diagnosis not present

## 2017-10-10 DIAGNOSIS — E1151 Type 2 diabetes mellitus with diabetic peripheral angiopathy without gangrene: Secondary | ICD-10-CM | POA: Diagnosis not present

## 2017-10-10 NOTE — Telephone Encounter (Signed)
Threasa Beards at [facility] [phone] needs verbal orders for PT: TWO times per week for FOUR weeks. Then after that, ONE time per week for FOUR weeks. Threasa Beards states pt lives alone and recommends home assessment from a social worker to assess the patient safety. Also she recommends a speech therapist visit pt for cognitive eval and memory issues.

## 2017-10-10 NOTE — Telephone Encounter (Signed)
Called [facility] and gave verbal ok per PCP for request.

## 2017-10-12 ENCOUNTER — Telehealth: Payer: Self-pay | Admitting: *Deleted

## 2017-10-12 DIAGNOSIS — E1159 Type 2 diabetes mellitus with other circulatory complications: Secondary | ICD-10-CM | POA: Diagnosis not present

## 2017-10-12 DIAGNOSIS — E1165 Type 2 diabetes mellitus with hyperglycemia: Secondary | ICD-10-CM | POA: Diagnosis not present

## 2017-10-12 DIAGNOSIS — G8929 Other chronic pain: Secondary | ICD-10-CM | POA: Diagnosis not present

## 2017-10-12 DIAGNOSIS — I714 Abdominal aortic aneurysm, without rupture: Secondary | ICD-10-CM | POA: Diagnosis not present

## 2017-10-12 DIAGNOSIS — M545 Low back pain: Secondary | ICD-10-CM | POA: Diagnosis not present

## 2017-10-12 DIAGNOSIS — G2581 Restless legs syndrome: Secondary | ICD-10-CM | POA: Diagnosis not present

## 2017-10-12 DIAGNOSIS — E1169 Type 2 diabetes mellitus with other specified complication: Secondary | ICD-10-CM | POA: Diagnosis not present

## 2017-10-12 DIAGNOSIS — K551 Chronic vascular disorders of intestine: Secondary | ICD-10-CM | POA: Diagnosis not present

## 2017-10-12 DIAGNOSIS — I6523 Occlusion and stenosis of bilateral carotid arteries: Secondary | ICD-10-CM | POA: Diagnosis not present

## 2017-10-12 DIAGNOSIS — I70213 Atherosclerosis of native arteries of extremities with intermittent claudication, bilateral legs: Secondary | ICD-10-CM | POA: Diagnosis not present

## 2017-10-12 DIAGNOSIS — I10 Essential (primary) hypertension: Secondary | ICD-10-CM | POA: Diagnosis not present

## 2017-10-12 DIAGNOSIS — E785 Hyperlipidemia, unspecified: Secondary | ICD-10-CM | POA: Diagnosis not present

## 2017-10-12 NOTE — Telephone Encounter (Signed)
Received Home Health Certification and Plan of Care; forwarded to provider/SLS 11/07

## 2017-10-13 ENCOUNTER — Encounter: Payer: Self-pay | Admitting: Family Medicine

## 2017-10-13 ENCOUNTER — Telehealth: Payer: Self-pay | Admitting: Pulmonary Disease

## 2017-10-13 ENCOUNTER — Ambulatory Visit (INDEPENDENT_AMBULATORY_CARE_PROVIDER_SITE_OTHER): Payer: Medicare Other | Admitting: Family Medicine

## 2017-10-13 VITALS — BP 108/68 | HR 57 | Temp 97.7°F | Ht 63.0 in | Wt 133.5 lb

## 2017-10-13 DIAGNOSIS — R296 Repeated falls: Secondary | ICD-10-CM | POA: Diagnosis not present

## 2017-10-13 DIAGNOSIS — M5126 Other intervertebral disc displacement, lumbar region: Secondary | ICD-10-CM | POA: Diagnosis not present

## 2017-10-13 DIAGNOSIS — M25512 Pain in left shoulder: Secondary | ICD-10-CM | POA: Diagnosis not present

## 2017-10-13 DIAGNOSIS — I10 Essential (primary) hypertension: Secondary | ICD-10-CM | POA: Diagnosis not present

## 2017-10-13 DIAGNOSIS — J449 Chronic obstructive pulmonary disease, unspecified: Secondary | ICD-10-CM | POA: Diagnosis not present

## 2017-10-13 DIAGNOSIS — E1151 Type 2 diabetes mellitus with diabetic peripheral angiopathy without gangrene: Secondary | ICD-10-CM | POA: Diagnosis not present

## 2017-10-13 MED ORDER — TIOTROPIUM BROMIDE MONOHYDRATE 18 MCG IN CAPS: 18.0000 ug | ORAL_CAPSULE | Freq: Every day | RESPIRATORY_TRACT | 2 refills | Status: DC

## 2017-10-13 MED ORDER — FLUTICASONE-SALMETEROL 250-50 MCG/DOSE IN AEPB: 1.0000 | INHALATION_SPRAY | Freq: Two times a day (BID) | RESPIRATORY_TRACT | 2 refills | Status: DC

## 2017-10-13 NOTE — Progress Notes (Signed)
Chief Complaint  Patient presents with  . Cough    productive  . Wheezing    Dawn Stewart here for URI complaints.  Here with daughter.  Duration: 2 days  Associated symptoms: rhinorrhea, itchy watery eyes and wheezing Denies: sinus congestion, sinus pain, ear pain, ear drainage, sore throat, shortness of breath, myalgia and fevers/rigors Treatment to date: None; she was placed on Trelegy but wasn't taking it 2/2 sandy taste in mouth. She had not let the pulm team know about this.  Sick contacts: No  ROS:  Const: Denies fevers HEENT: As noted in HPI Lungs: No SOB  Past Medical History:  Diagnosis Date  . Asthma   . COPD (chronic obstructive pulmonary disease) ([place])   . Diabetes mellitus without complication ([place])   . Essential hypertension 05/27/2017  . Hypertension   . Post traumatic stress disorder (PTSD)    Family History  Problem Relation Age of Onset  . Heart disease Mother   . Heart disease Father     BP 108/68 (BP Location: Right Arm, Patient Position: Sitting, Cuff Size: Normal)   Pulse (!) 57   Temp 97.7 F (36.5 C) (Oral)   Ht 5\' 3"  (1.6 m)   Wt 133 lb 8 oz (60.6 kg)   SpO2 97%   BMI 23.65 kg/m  General: Awake, alert, appears stated age HEENT: AT, Webb, ears patent b/l and TM's neg, nares patent w/o discharge, pharynx pink and without exudates, MMM Neck: No masses or asymmetry Heart: RRR, no murmurs, no bruits Lungs: CTAB, no accessory muscle use Psych: Age appropriate judgment and insight, normal mood and affect  Chronic obstructive pulmonary disease, unspecified COPD type (HCC)  Normal exam, she appears to be getting better overall.  I will add back a daily anticholinergic and inhaled corticosteroid/long-acting beta agonist.  She is to call her pulmonology team and inform them of her reluctance to take the previous medicine.  They may wish to change her to a different regimen. Continue to push fluids, practice good hand hygiene, cover mouth when  coughing. F/u prn. If starting to experience fevers, shaking, or shortness of breath, seek immediate care. Pt voiced understanding and agreement to the plan.  [place], DO 10/13/17 2:35 PM

## 2017-10-13 NOTE — Patient Instructions (Addendum)
Call Dr. Bari Mantis office and see if they are OK with you changing from Trelegy to Hawkins.  Continue to push fluids, practice good hand hygiene, and cover your mouth if you cough.  If you start having fevers, shaking or shortness of breath, seek immediate care.  Let us know if you need anything.

## 2017-10-13 NOTE — Telephone Encounter (Signed)
lmtcb x1 for pt's daughter [name].

## 2017-10-13 NOTE — Progress Notes (Signed)
Pre visit review using our clinic review tool, if applicable. No additional management support is needed unless otherwise documented below in the visit note. 

## 2017-10-14 ENCOUNTER — Telehealth: Payer: Self-pay | Admitting: Family Medicine

## 2017-10-14 DIAGNOSIS — M5126 Other intervertebral disc displacement, lumbar region: Secondary | ICD-10-CM | POA: Diagnosis not present

## 2017-10-14 DIAGNOSIS — J449 Chronic obstructive pulmonary disease, unspecified: Secondary | ICD-10-CM | POA: Diagnosis not present

## 2017-10-14 DIAGNOSIS — M25512 Pain in left shoulder: Secondary | ICD-10-CM | POA: Diagnosis not present

## 2017-10-14 DIAGNOSIS — I10 Essential (primary) hypertension: Secondary | ICD-10-CM | POA: Diagnosis not present

## 2017-10-14 DIAGNOSIS — E1151 Type 2 diabetes mellitus with diabetic peripheral angiopathy without gangrene: Secondary | ICD-10-CM | POA: Diagnosis not present

## 2017-10-14 DIAGNOSIS — R296 Repeated falls: Secondary | ICD-10-CM | POA: Diagnosis not present

## 2017-10-14 NOTE — Telephone Encounter (Signed)
Called HHRN informed of verbal ok per PCP. 

## 2017-10-14 NOTE — Telephone Encounter (Signed)
lmomtcb x 2  For Renee, pts daughter.
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
## 2017-10-14 NOTE — Telephone Encounter (Signed)
[place]- [phone]   Called in to request verbal orders for speech therapy     Frequency: 2 week 2                     1 week 2

## 2017-10-17 ENCOUNTER — Telehealth: Payer: Self-pay | Admitting: Family Medicine

## 2017-10-17 ENCOUNTER — Telehealth: Payer: Self-pay | Admitting: *Deleted

## 2017-10-17 DIAGNOSIS — E1151 Type 2 diabetes mellitus with diabetic peripheral angiopathy without gangrene: Secondary | ICD-10-CM | POA: Diagnosis not present

## 2017-10-17 DIAGNOSIS — M5126 Other intervertebral disc displacement, lumbar region: Secondary | ICD-10-CM | POA: Diagnosis not present

## 2017-10-17 DIAGNOSIS — J449 Chronic obstructive pulmonary disease, unspecified: Secondary | ICD-10-CM | POA: Diagnosis not present

## 2017-10-17 DIAGNOSIS — R296 Repeated falls: Secondary | ICD-10-CM | POA: Diagnosis not present

## 2017-10-17 DIAGNOSIS — M25512 Pain in left shoulder: Secondary | ICD-10-CM | POA: Diagnosis not present

## 2017-10-17 DIAGNOSIS — I10 Essential (primary) hypertension: Secondary | ICD-10-CM | POA: Diagnosis not present

## 2017-10-17 NOTE — Telephone Encounter (Signed)
lmtcb x3 for pt's daughter.

## 2017-10-17 NOTE — Telephone Encounter (Signed)
Updated in chart. Called [facility] Anderson Malta RN at [phone] informed of instructions.

## 2017-10-17 NOTE — Telephone Encounter (Signed)
[facility] Anderson Malta RN called needing clarification on patients Zoloft. She states her current bottle states zoloft 100 mg once daily,  But the patient has clearly been taking zoloft 100 mg 2 daily.  The patient and her daughter both state that is how she has always taken zoloft. The RN checked there computer which states she takes 200 mg daily, but bottle is 100 mg daily.  The RN did not check this patient into their care so not sure if was just recorded incorrectly..  I did inform her that no where in her chart that I could find current med list/historical had her instructions stated to take 2 per day of zoloft 100 mg. She states she does need to know what to do/  Also that currently she has enough left for 3 more days left (taking as she is taking it)/was filled last on 08/31/2017 For #90.

## 2017-10-17 NOTE — Telephone Encounter (Signed)
Copied from [place] [id number]. Topic: Quick Communication - See Telephone Encounter >> Oct 17, 2017 12:08 PM Oneta Rack wrote: Osvaldo Human name: Anderson Malta Relation to pt: RN from [place]  Call back number: [phone] Pharmacy:  Reason for call:  Anderson Malta (RN) from [place] in need of clarity regarding sertraline (ZOLOFT) 100 MG tablet direction, please advise

## 2017-10-17 NOTE — Telephone Encounter (Signed)
Received Physician Orders [x2] from [facility]; forwarded to provider/SLS 11/12

## 2017-10-17 NOTE — Telephone Encounter (Signed)
OK to reorder at 200 mg daily. Please change in chart also. TY.

## 2017-10-18 NOTE — Telephone Encounter (Signed)
lmtcb X4 for pt's daughter [name].  Will close encounter per triage protocol.

## 2017-10-19 DIAGNOSIS — E1151 Type 2 diabetes mellitus with diabetic peripheral angiopathy without gangrene: Secondary | ICD-10-CM | POA: Diagnosis not present

## 2017-10-19 DIAGNOSIS — M25512 Pain in left shoulder: Secondary | ICD-10-CM | POA: Diagnosis not present

## 2017-10-19 DIAGNOSIS — M5126 Other intervertebral disc displacement, lumbar region: Secondary | ICD-10-CM | POA: Diagnosis not present

## 2017-10-19 DIAGNOSIS — I10 Essential (primary) hypertension: Secondary | ICD-10-CM | POA: Diagnosis not present

## 2017-10-19 DIAGNOSIS — J449 Chronic obstructive pulmonary disease, unspecified: Secondary | ICD-10-CM | POA: Diagnosis not present

## 2017-10-19 DIAGNOSIS — R296 Repeated falls: Secondary | ICD-10-CM | POA: Diagnosis not present

## 2017-10-20 ENCOUNTER — Telehealth: Payer: Self-pay | Admitting: Family Medicine

## 2017-10-20 NOTE — Telephone Encounter (Signed)
Social Worker Gladstone Pih requesting 2 routine and 2 prns visits.  PCP did verbally ok this request.

## 2017-10-21 ENCOUNTER — Telehealth: Payer: Self-pay | Admitting: *Deleted

## 2017-10-21 DIAGNOSIS — M5126 Other intervertebral disc displacement, lumbar region: Secondary | ICD-10-CM | POA: Diagnosis not present

## 2017-10-21 DIAGNOSIS — R296 Repeated falls: Secondary | ICD-10-CM | POA: Diagnosis not present

## 2017-10-21 DIAGNOSIS — I10 Essential (primary) hypertension: Secondary | ICD-10-CM | POA: Diagnosis not present

## 2017-10-21 DIAGNOSIS — E1151 Type 2 diabetes mellitus with diabetic peripheral angiopathy without gangrene: Secondary | ICD-10-CM | POA: Diagnosis not present

## 2017-10-21 DIAGNOSIS — J449 Chronic obstructive pulmonary disease, unspecified: Secondary | ICD-10-CM | POA: Diagnosis not present

## 2017-10-21 DIAGNOSIS — M25512 Pain in left shoulder: Secondary | ICD-10-CM | POA: Diagnosis not present

## 2017-10-21 NOTE — Telephone Encounter (Signed)
Received Physician Orders from [facility]; forwarded to provider/SLS 11/16  

## 2017-10-24 ENCOUNTER — Other Ambulatory Visit: Payer: Self-pay | Admitting: Family Medicine

## 2017-10-24 DIAGNOSIS — M5126 Other intervertebral disc displacement, lumbar region: Secondary | ICD-10-CM | POA: Diagnosis not present

## 2017-10-24 DIAGNOSIS — J449 Chronic obstructive pulmonary disease, unspecified: Secondary | ICD-10-CM | POA: Diagnosis not present

## 2017-10-24 DIAGNOSIS — E1151 Type 2 diabetes mellitus with diabetic peripheral angiopathy without gangrene: Secondary | ICD-10-CM | POA: Diagnosis not present

## 2017-10-24 DIAGNOSIS — R296 Repeated falls: Secondary | ICD-10-CM | POA: Diagnosis not present

## 2017-10-24 DIAGNOSIS — M25512 Pain in left shoulder: Secondary | ICD-10-CM | POA: Diagnosis not present

## 2017-10-24 DIAGNOSIS — I10 Essential (primary) hypertension: Secondary | ICD-10-CM | POA: Diagnosis not present

## 2017-10-24 NOTE — Telephone Encounter (Signed)
Patient needs refill on Zoloft -100 mg tabs 2x a day. [facility]  Patient is having trouble- completely out. [place] [phone]

## 2017-10-25 DIAGNOSIS — J449 Chronic obstructive pulmonary disease, unspecified: Secondary | ICD-10-CM | POA: Diagnosis not present

## 2017-10-25 DIAGNOSIS — M25512 Pain in left shoulder: Secondary | ICD-10-CM | POA: Diagnosis not present

## 2017-10-25 DIAGNOSIS — M5126 Other intervertebral disc displacement, lumbar region: Secondary | ICD-10-CM | POA: Diagnosis not present

## 2017-10-25 DIAGNOSIS — E1151 Type 2 diabetes mellitus with diabetic peripheral angiopathy without gangrene: Secondary | ICD-10-CM | POA: Diagnosis not present

## 2017-10-25 DIAGNOSIS — I10 Essential (primary) hypertension: Secondary | ICD-10-CM | POA: Diagnosis not present

## 2017-10-25 DIAGNOSIS — R296 Repeated falls: Secondary | ICD-10-CM | POA: Diagnosis not present

## 2017-10-25 MED ORDER — SERTRALINE HCL 100 MG PO TABS: ORAL_TABLET | ORAL | 4 refills | Status: DC

## 2017-10-25 NOTE — Telephone Encounter (Signed)
Refill sent in today. To [id number] with 4 refills.

## 2017-10-26 ENCOUNTER — Telehealth: Payer: Self-pay | Admitting: *Deleted

## 2017-10-26 DIAGNOSIS — I10 Essential (primary) hypertension: Secondary | ICD-10-CM | POA: Diagnosis not present

## 2017-10-26 DIAGNOSIS — M5126 Other intervertebral disc displacement, lumbar region: Secondary | ICD-10-CM | POA: Diagnosis not present

## 2017-10-26 DIAGNOSIS — J449 Chronic obstructive pulmonary disease, unspecified: Secondary | ICD-10-CM | POA: Diagnosis not present

## 2017-10-26 DIAGNOSIS — E1151 Type 2 diabetes mellitus with diabetic peripheral angiopathy without gangrene: Secondary | ICD-10-CM | POA: Diagnosis not present

## 2017-10-26 DIAGNOSIS — R296 Repeated falls: Secondary | ICD-10-CM | POA: Diagnosis not present

## 2017-10-26 DIAGNOSIS — M25512 Pain in left shoulder: Secondary | ICD-10-CM | POA: Diagnosis not present

## 2017-10-26 NOTE — Telephone Encounter (Signed)
Received Physician Orders [x2] from [facility]; forwarded to provider/SLS 11/21

## 2017-10-31 ENCOUNTER — Telehealth: Payer: Self-pay | Admitting: Family Medicine

## 2017-10-31 DIAGNOSIS — M25512 Pain in left shoulder: Secondary | ICD-10-CM | POA: Diagnosis not present

## 2017-10-31 DIAGNOSIS — R296 Repeated falls: Secondary | ICD-10-CM | POA: Diagnosis not present

## 2017-10-31 DIAGNOSIS — I10 Essential (primary) hypertension: Secondary | ICD-10-CM | POA: Diagnosis not present

## 2017-10-31 DIAGNOSIS — M5126 Other intervertebral disc displacement, lumbar region: Secondary | ICD-10-CM | POA: Diagnosis not present

## 2017-10-31 DIAGNOSIS — E1151 Type 2 diabetes mellitus with diabetic peripheral angiopathy without gangrene: Secondary | ICD-10-CM | POA: Diagnosis not present

## 2017-10-31 DIAGNOSIS — J449 Chronic obstructive pulmonary disease, unspecified: Secondary | ICD-10-CM | POA: Diagnosis not present

## 2017-10-31 NOTE — Telephone Encounter (Signed)
Relation to pt: self  Call back number: [phone] Pharmacy: [facility] [place], [place] [phone] (Phone) [phone] (Fax)     Reason for call:  Patient would like to know if PCP can write her script for a no prick glucose meter, patient unsure if [facility] or Insurance will cover, patient has a follow up appointment with PCP for 11/03/17, please advise

## 2017-10-31 NOTE — Telephone Encounter (Signed)
  Some insurances only cover Type 1 diabetics, and then some cover type 1 and type 2, however they must be insulin dependent such as they use insulin 3-4 times daily or on insulin pump, and are checking via fingerstick 3-4 times daily or on insulin pump  Received this message from employee that does PA's.  Informed the daughter of PA requirements and this patient would not qualify and insurance would not pay for it.  She had already tried to explain this to the patient and will inform her.

## 2017-10-31 NOTE — Telephone Encounter (Signed)
OK. Ty.  

## 2017-11-01 DIAGNOSIS — E1151 Type 2 diabetes mellitus with diabetic peripheral angiopathy without gangrene: Secondary | ICD-10-CM | POA: Diagnosis not present

## 2017-11-01 DIAGNOSIS — I10 Essential (primary) hypertension: Secondary | ICD-10-CM | POA: Diagnosis not present

## 2017-11-01 DIAGNOSIS — J449 Chronic obstructive pulmonary disease, unspecified: Secondary | ICD-10-CM | POA: Diagnosis not present

## 2017-11-01 DIAGNOSIS — R296 Repeated falls: Secondary | ICD-10-CM | POA: Diagnosis not present

## 2017-11-01 DIAGNOSIS — M25512 Pain in left shoulder: Secondary | ICD-10-CM | POA: Diagnosis not present

## 2017-11-01 DIAGNOSIS — M5126 Other intervertebral disc displacement, lumbar region: Secondary | ICD-10-CM | POA: Diagnosis not present

## 2017-11-02 ENCOUNTER — Ambulatory Visit: Payer: Self-pay | Admitting: Allergy and Immunology

## 2017-11-02 ENCOUNTER — Other Ambulatory Visit: Payer: Self-pay | Admitting: Family Medicine

## 2017-11-02 DIAGNOSIS — R296 Repeated falls: Secondary | ICD-10-CM | POA: Diagnosis not present

## 2017-11-02 DIAGNOSIS — I10 Essential (primary) hypertension: Secondary | ICD-10-CM | POA: Diagnosis not present

## 2017-11-02 DIAGNOSIS — E1151 Type 2 diabetes mellitus with diabetic peripheral angiopathy without gangrene: Secondary | ICD-10-CM | POA: Diagnosis not present

## 2017-11-02 DIAGNOSIS — M25512 Pain in left shoulder: Secondary | ICD-10-CM | POA: Diagnosis not present

## 2017-11-02 DIAGNOSIS — J449 Chronic obstructive pulmonary disease, unspecified: Secondary | ICD-10-CM | POA: Diagnosis not present

## 2017-11-02 DIAGNOSIS — M5126 Other intervertebral disc displacement, lumbar region: Secondary | ICD-10-CM | POA: Diagnosis not present

## 2017-11-03 ENCOUNTER — Ambulatory Visit: Payer: Self-pay | Admitting: Family Medicine

## 2017-11-04 DIAGNOSIS — F4321 Adjustment disorder with depressed mood: Secondary | ICD-10-CM | POA: Diagnosis not present

## 2017-11-07 DIAGNOSIS — R296 Repeated falls: Secondary | ICD-10-CM | POA: Diagnosis not present

## 2017-11-07 DIAGNOSIS — J449 Chronic obstructive pulmonary disease, unspecified: Secondary | ICD-10-CM | POA: Diagnosis not present

## 2017-11-07 DIAGNOSIS — I10 Essential (primary) hypertension: Secondary | ICD-10-CM | POA: Diagnosis not present

## 2017-11-07 DIAGNOSIS — M25512 Pain in left shoulder: Secondary | ICD-10-CM | POA: Diagnosis not present

## 2017-11-07 DIAGNOSIS — E1151 Type 2 diabetes mellitus with diabetic peripheral angiopathy without gangrene: Secondary | ICD-10-CM | POA: Diagnosis not present

## 2017-11-07 DIAGNOSIS — M5126 Other intervertebral disc displacement, lumbar region: Secondary | ICD-10-CM | POA: Diagnosis not present

## 2017-11-08 DIAGNOSIS — E1151 Type 2 diabetes mellitus with diabetic peripheral angiopathy without gangrene: Secondary | ICD-10-CM | POA: Diagnosis not present

## 2017-11-08 DIAGNOSIS — M5126 Other intervertebral disc displacement, lumbar region: Secondary | ICD-10-CM | POA: Diagnosis not present

## 2017-11-08 DIAGNOSIS — M25512 Pain in left shoulder: Secondary | ICD-10-CM | POA: Diagnosis not present

## 2017-11-08 DIAGNOSIS — I10 Essential (primary) hypertension: Secondary | ICD-10-CM | POA: Diagnosis not present

## 2017-11-08 DIAGNOSIS — R296 Repeated falls: Secondary | ICD-10-CM | POA: Diagnosis not present

## 2017-11-08 DIAGNOSIS — J449 Chronic obstructive pulmonary disease, unspecified: Secondary | ICD-10-CM | POA: Diagnosis not present

## 2017-11-09 ENCOUNTER — Other Ambulatory Visit: Payer: Self-pay | Admitting: Family Medicine

## 2017-11-09 DIAGNOSIS — J449 Chronic obstructive pulmonary disease, unspecified: Secondary | ICD-10-CM | POA: Diagnosis not present

## 2017-11-09 DIAGNOSIS — I10 Essential (primary) hypertension: Secondary | ICD-10-CM | POA: Diagnosis not present

## 2017-11-09 DIAGNOSIS — E1151 Type 2 diabetes mellitus with diabetic peripheral angiopathy without gangrene: Secondary | ICD-10-CM | POA: Diagnosis not present

## 2017-11-09 DIAGNOSIS — M5126 Other intervertebral disc displacement, lumbar region: Secondary | ICD-10-CM | POA: Diagnosis not present

## 2017-11-09 DIAGNOSIS — R296 Repeated falls: Secondary | ICD-10-CM | POA: Diagnosis not present

## 2017-11-09 DIAGNOSIS — M25512 Pain in left shoulder: Secondary | ICD-10-CM | POA: Diagnosis not present

## 2017-11-10 ENCOUNTER — Telehealth: Payer: Self-pay | Admitting: Pulmonary Disease

## 2017-11-10 NOTE — Telephone Encounter (Signed)
I am not sure what Margie called about.

## 2017-11-10 NOTE — Telephone Encounter (Signed)
Pt's daughter aware that I have not attempted to call her today.  Last attempt to reach pt's daughter was 10/13/17. Pt's daughter [name] wanted to make RA aware that [facility] switched Trelegy to [facility].  RA please advise if you agree with this change.

## 2017-11-11 ENCOUNTER — Telehealth: Payer: Self-pay | Admitting: Family Medicine

## 2017-11-11 NOTE — Telephone Encounter (Signed)
Copied from [place]. Topic: Quick Communication - Rx Refill/Question >> Nov 11, 2017  4:20 PM Carolyn Stare wrote: Has the patient contacted their [place]     sertraline (ZOLOFT) 100 MG tablet  Preferred Pharmacy (with phone number or street name  [facility]    Agent: Please be advised that RX refills may take up to 3 business days. We ask that you follow-up with your pharmacy.

## 2017-11-11 NOTE — Telephone Encounter (Signed)
Called and lmom for Renna and made her aware that RA is ok with the change in meds.  Nothing further is needed.

## 2017-11-11 NOTE — Telephone Encounter (Signed)
OK 

## 2017-11-15 ENCOUNTER — Other Ambulatory Visit: Payer: Self-pay | Admitting: *Deleted

## 2017-11-16 DIAGNOSIS — I10 Essential (primary) hypertension: Secondary | ICD-10-CM | POA: Diagnosis not present

## 2017-11-16 DIAGNOSIS — J449 Chronic obstructive pulmonary disease, unspecified: Secondary | ICD-10-CM | POA: Diagnosis not present

## 2017-11-16 DIAGNOSIS — R296 Repeated falls: Secondary | ICD-10-CM | POA: Diagnosis not present

## 2017-11-16 DIAGNOSIS — M25512 Pain in left shoulder: Secondary | ICD-10-CM | POA: Diagnosis not present

## 2017-11-16 DIAGNOSIS — E1151 Type 2 diabetes mellitus with diabetic peripheral angiopathy without gangrene: Secondary | ICD-10-CM | POA: Diagnosis not present

## 2017-11-16 DIAGNOSIS — M5126 Other intervertebral disc displacement, lumbar region: Secondary | ICD-10-CM | POA: Diagnosis not present

## 2017-11-18 ENCOUNTER — Ambulatory Visit: Payer: Self-pay | Admitting: Family Medicine

## 2017-11-18 DIAGNOSIS — F4321 Adjustment disorder with depressed mood: Secondary | ICD-10-CM | POA: Diagnosis not present

## 2017-11-23 ENCOUNTER — Telehealth: Payer: Self-pay | Admitting: *Deleted

## 2017-11-23 ENCOUNTER — Ambulatory Visit: Payer: Self-pay | Admitting: Family Medicine

## 2017-11-23 DIAGNOSIS — M5126 Other intervertebral disc displacement, lumbar region: Secondary | ICD-10-CM | POA: Diagnosis not present

## 2017-11-23 DIAGNOSIS — I10 Essential (primary) hypertension: Secondary | ICD-10-CM | POA: Diagnosis not present

## 2017-11-23 DIAGNOSIS — R296 Repeated falls: Secondary | ICD-10-CM | POA: Diagnosis not present

## 2017-11-23 DIAGNOSIS — J449 Chronic obstructive pulmonary disease, unspecified: Secondary | ICD-10-CM | POA: Diagnosis not present

## 2017-11-23 DIAGNOSIS — M25512 Pain in left shoulder: Secondary | ICD-10-CM | POA: Diagnosis not present

## 2017-11-23 DIAGNOSIS — E1151 Type 2 diabetes mellitus with diabetic peripheral angiopathy without gangrene: Secondary | ICD-10-CM | POA: Diagnosis not present

## 2017-11-23 NOTE — Telephone Encounter (Signed)
Received Physician Orders from [facility]; forwarded to provider/SLS 12/19  

## 2017-11-25 DIAGNOSIS — F4321 Adjustment disorder with depressed mood: Secondary | ICD-10-CM | POA: Diagnosis not present

## 2017-11-25 DIAGNOSIS — E1151 Type 2 diabetes mellitus with diabetic peripheral angiopathy without gangrene: Secondary | ICD-10-CM | POA: Diagnosis not present

## 2017-11-25 DIAGNOSIS — I10 Essential (primary) hypertension: Secondary | ICD-10-CM | POA: Diagnosis not present

## 2017-11-25 DIAGNOSIS — R296 Repeated falls: Secondary | ICD-10-CM | POA: Diagnosis not present

## 2017-11-25 DIAGNOSIS — M25512 Pain in left shoulder: Secondary | ICD-10-CM | POA: Diagnosis not present

## 2017-11-25 DIAGNOSIS — J449 Chronic obstructive pulmonary disease, unspecified: Secondary | ICD-10-CM | POA: Diagnosis not present

## 2017-11-25 DIAGNOSIS — M5126 Other intervertebral disc displacement, lumbar region: Secondary | ICD-10-CM | POA: Diagnosis not present

## 2017-12-01 ENCOUNTER — Ambulatory Visit (INDEPENDENT_AMBULATORY_CARE_PROVIDER_SITE_OTHER): Payer: Medicare Other | Admitting: Family Medicine

## 2017-12-01 ENCOUNTER — Encounter: Payer: Self-pay | Admitting: Family Medicine

## 2017-12-01 VITALS — BP 138/80 | HR 65 | Temp 98.4°F | Ht 63.0 in | Wt 132.2 lb

## 2017-12-01 DIAGNOSIS — G2581 Restless legs syndrome: Secondary | ICD-10-CM | POA: Diagnosis not present

## 2017-12-01 DIAGNOSIS — F4310 Post-traumatic stress disorder, unspecified: Secondary | ICD-10-CM

## 2017-12-01 DIAGNOSIS — Z0289 Encounter for other administrative examinations: Secondary | ICD-10-CM | POA: Diagnosis not present

## 2017-12-01 IMAGING — DX DG CHEST 2V
2 series · 2 of 2 positions shown · non-contrast
Comparison: None

CLINICAL DATA: Chronic obstructive pulmonary disease, follow-up
pneumonia, shortness of breath, diabetes mellitus, hypertension,
former smoker, asthma

EXAM:
CHEST  2 VIEW

[chest pa]
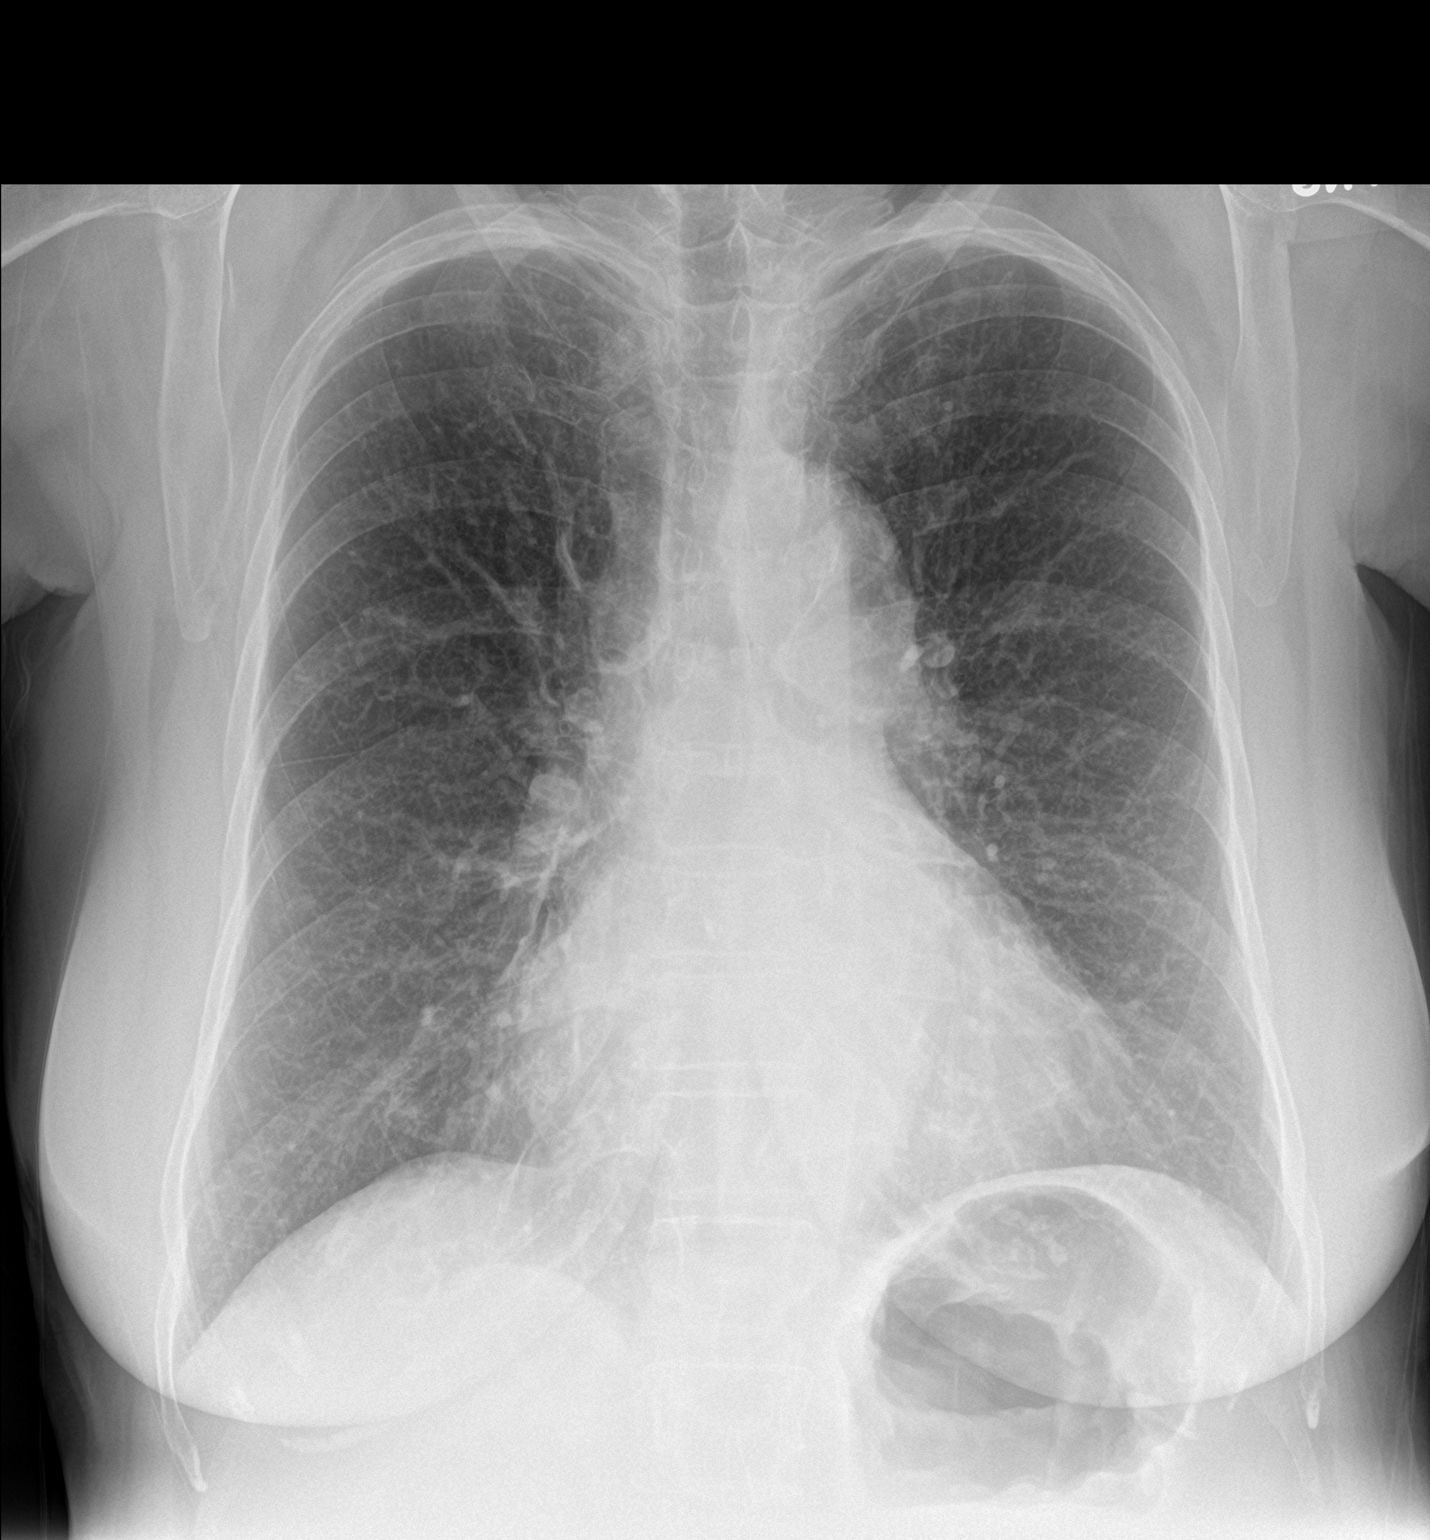

[chest lat]
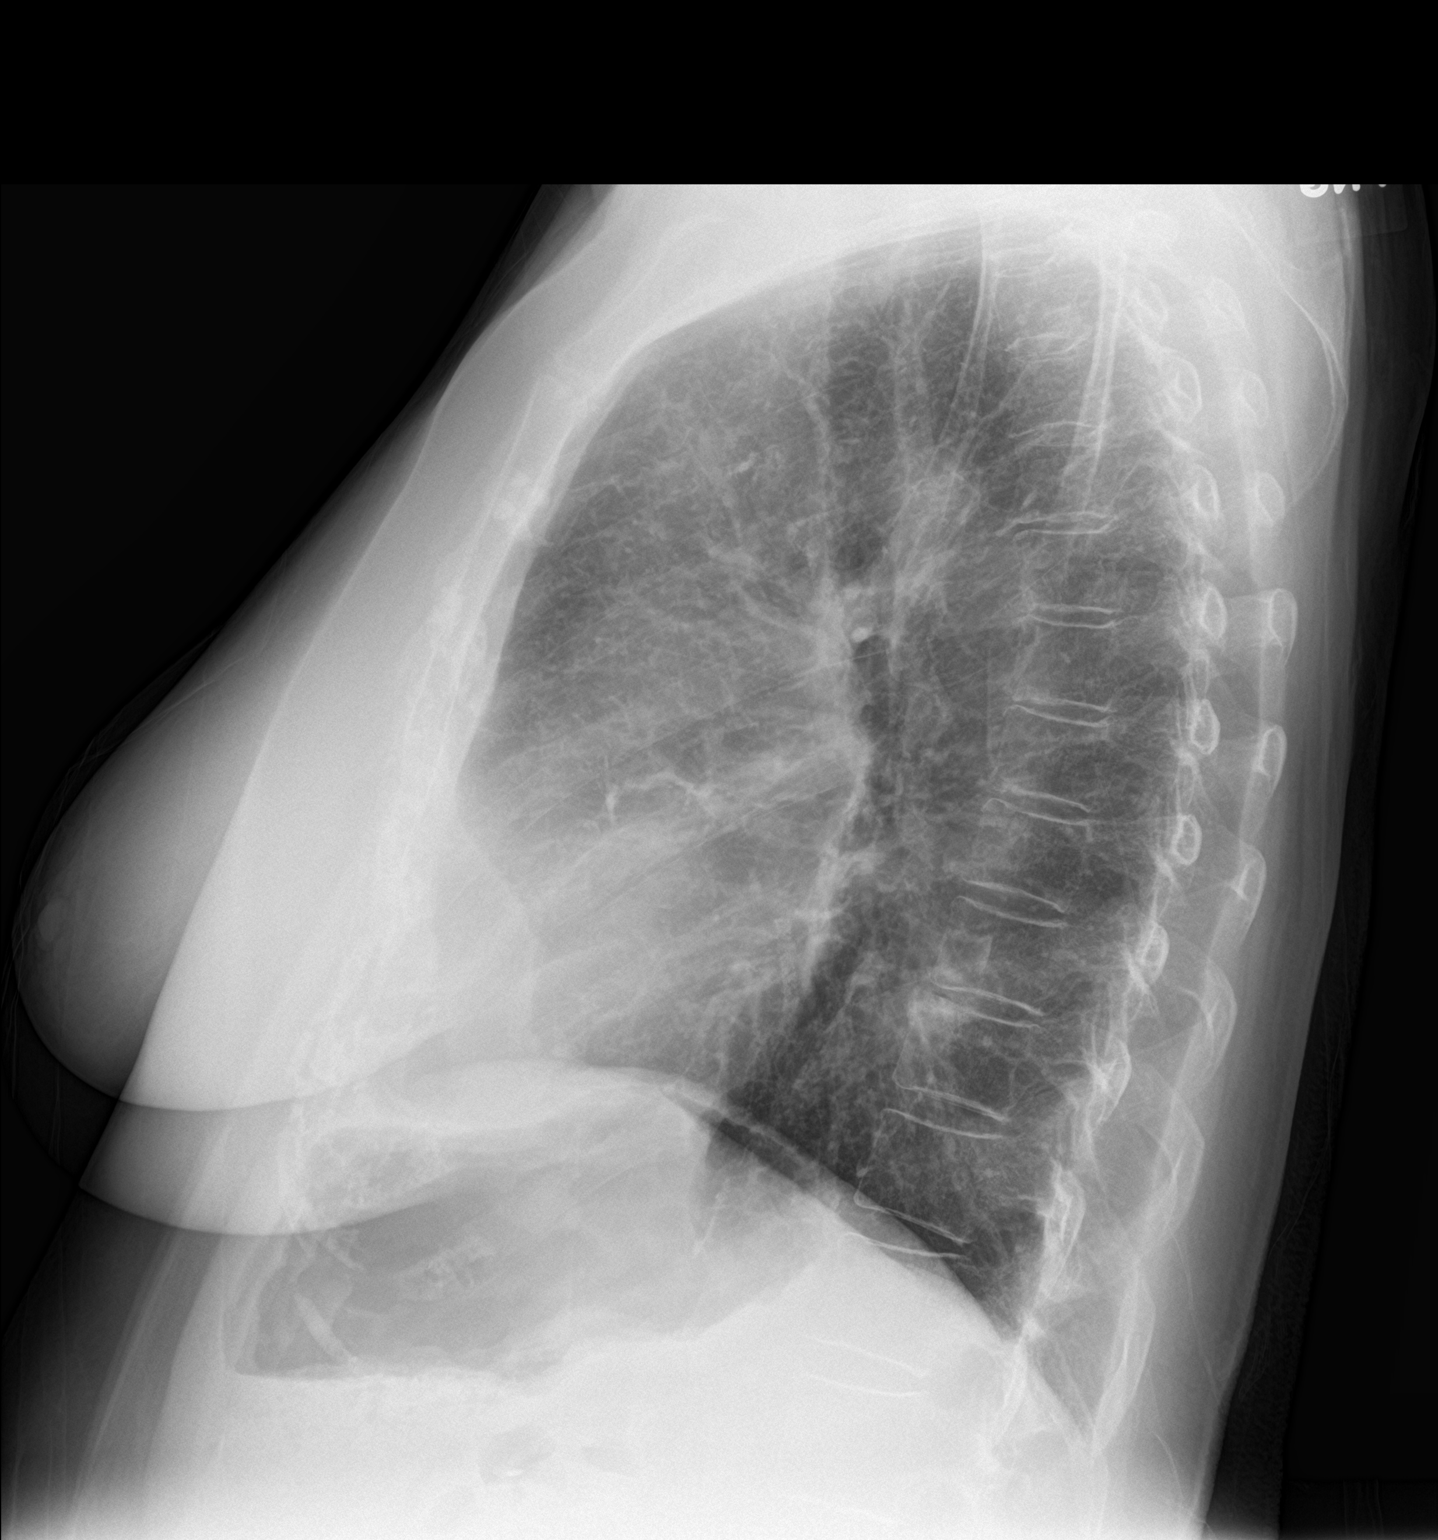

[2 of 2 positions shown; findings below may reference images not displayed]

FINDINGS: Enlargement of cardiac silhouette.

Mediastinal contours and pulmonary vascularity normal.

Bronchitic changes and minimal interstitial prominence.

No acute infiltrate, pleural effusion or pneumothorax.

Bones demineralized.

LEFT carotid stent with surgical clips in RIGHT cervical region.
IMPRESSION: Mild enlargement of cardiac silhouette.

Bronchitic changes without definite acute infiltrate.

## 2017-12-01 MED ORDER — CARBIDOPA-LEVODOPA 25-100 MG PO TABS: 2.0000 | ORAL_TABLET | Freq: Every day | ORAL | 1 refills | Status: DC

## 2017-12-01 MED ORDER — SERTRALINE HCL 100 MG PO TABS: ORAL_TABLET | ORAL | 1 refills | Status: DC

## 2017-12-01 MED ORDER — VENLAFAXINE HCL ER 75 MG PO CP24: 75.0000 mg | ORAL_CAPSULE | Freq: Every day | ORAL | 1 refills | Status: DC

## 2017-12-01 NOTE — Patient Instructions (Addendum)
[facility] [place], [place] [address] [address], [place] [address] [facility] [address] [place],  [address] [phone]  Call one of these offices sooner than later as it can take 2-3 months to get a new patient appointment.   Stop Zoloft and the next day OK to transition to Effexor.

## 2017-12-01 NOTE — Progress Notes (Signed)
Pre visit review using our clinic review tool, if applicable. No additional management support is needed unless otherwise documented below in the visit note. 
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
## 2017-12-02 ENCOUNTER — Telehealth: Payer: Self-pay | Admitting: *Deleted

## 2017-12-02 DIAGNOSIS — F4321 Adjustment disorder with depressed mood: Secondary | ICD-10-CM | POA: Diagnosis not present

## 2017-12-02 NOTE — Telephone Encounter (Signed)
LMOM with contact name and number for return call RE: Dept of [place] paperwork left with Dr.Wendling at pt's 12/01/17 OV that has been completed, and we are needing to know if they would like to p/u the paperwork or have it mailed [as there is no return information for the [place] per provider instructions/SLS 12/28

## 2017-12-09 DIAGNOSIS — F4321 Adjustment disorder with depressed mood: Secondary | ICD-10-CM | POA: Diagnosis not present

## 2017-12-09 NOTE — Telephone Encounter (Signed)
Pt's daughter called back in to provide a fax # to the [place] : [phone]

## 2017-12-13 DIAGNOSIS — E1165 Type 2 diabetes mellitus with hyperglycemia: Secondary | ICD-10-CM | POA: Diagnosis not present

## 2017-12-13 DIAGNOSIS — H353223 Exudative age-related macular degeneration, left eye, with inactive scar: Secondary | ICD-10-CM | POA: Diagnosis not present

## 2017-12-13 DIAGNOSIS — H353211 Exudative age-related macular degeneration, right eye, with active choroidal neovascularization: Secondary | ICD-10-CM | POA: Diagnosis not present

## 2017-12-13 DIAGNOSIS — H43821 Vitreomacular adhesion, right eye: Secondary | ICD-10-CM | POA: Diagnosis not present

## 2017-12-13 DIAGNOSIS — H5440 Blindness, one eye, unspecified eye: Secondary | ICD-10-CM | POA: Diagnosis not present

## 2017-12-15 NOTE — Telephone Encounter (Signed)
Paperwork resent via fax to [place]; will call pt's daughter to inform she can p/u original at front desk, will make copy for scan/SLS

## 2017-12-16 DIAGNOSIS — F4321 Adjustment disorder with depressed mood: Secondary | ICD-10-CM | POA: Diagnosis not present

## 2017-12-23 ENCOUNTER — Other Ambulatory Visit: Payer: Self-pay | Admitting: Family Medicine

## 2017-12-23 DIAGNOSIS — F4321 Adjustment disorder with depressed mood: Secondary | ICD-10-CM | POA: Diagnosis not present

## 2018-01-06 DIAGNOSIS — F4321 Adjustment disorder with depressed mood: Secondary | ICD-10-CM | POA: Diagnosis not present

## 2018-01-11 ENCOUNTER — Telehealth: Payer: Self-pay

## 2018-01-11 MED ORDER — SERTRALINE HCL 100 MG PO TABS: ORAL_TABLET | ORAL | 0 refills | Status: DC

## 2018-01-11 NOTE — Telephone Encounter (Signed)
That's fine TY

## 2018-01-11 NOTE — Addendum Note (Signed)
Addended by: Sharon Seller B on: 01/11/2018 01:16 PM   Modules accepted: Orders

## 2018-01-11 NOTE — Telephone Encounter (Signed)
I am not prescribing her Lexapro (escitalopram). We tried Effexor (venlafaxine). Is that what she is needing? Can we get some more info on this coma issue? TY.

## 2018-01-11 NOTE — Telephone Encounter (Signed)
The daughter had surgery and aspirated on the table and was in the ICU since Jan 22 and is now out of ICU, but still in the hospital. She states the patient never got the effexor because mail order would not fill because she had been on zoloft and still had some. She now is almost out.  Marland Kitchen She would like to know if PCP would send zoloft to her local pharmacy [facility] [place] on [address] and she will get it for her and just pay cash. Marland Kitchen

## 2018-01-11 NOTE — Telephone Encounter (Signed)
Triage nurse from the [facility] center called to let us know Dawn Stewart is requesting another rx for Lexapro. She said her daughter takes care of all of her medication and her daughter has been sick "just came out of a coma". Patient is not able to find medication.  Please advise.

## 2018-01-11 NOTE — Telephone Encounter (Signed)
Sent in and the daughter informed

## 2018-01-13 DIAGNOSIS — F4321 Adjustment disorder with depressed mood: Secondary | ICD-10-CM | POA: Diagnosis not present

## 2018-01-20 DIAGNOSIS — F4321 Adjustment disorder with depressed mood: Secondary | ICD-10-CM | POA: Diagnosis not present

## 2018-01-24 ENCOUNTER — Other Ambulatory Visit: Payer: Self-pay | Admitting: Family Medicine

## 2018-01-24 DIAGNOSIS — F4310 Post-traumatic stress disorder, unspecified: Secondary | ICD-10-CM

## 2018-01-24 NOTE — Telephone Encounter (Signed)
Copied from [place]. Topic: General - Other >> Jan 24, 2018 12:32 PM Darl Householder, RMA wrote: Reason for CRM: Medication refill request for venlafaxine XR (EFFEXOR-XR) 75 MG 24 hr capsule to [facility], pt states they are requesting the generic brand its the only way they will pay for it

## 2018-01-25 MED ORDER — VENLAFAXINE HCL ER 75 MG PO CP24: 75.0000 mg | ORAL_CAPSULE | Freq: Every day | ORAL | 1 refills | Status: DC

## 2018-01-25 NOTE — Telephone Encounter (Signed)
OK 

## 2018-01-25 NOTE — Addendum Note (Signed)
Addended by: Sharon Seller B on: 01/25/2018 07:58 AM   Modules accepted: Orders

## 2018-01-26 DIAGNOSIS — F4321 Adjustment disorder with depressed mood: Secondary | ICD-10-CM | POA: Diagnosis not present

## 2018-01-30 ENCOUNTER — Other Ambulatory Visit: Payer: Self-pay | Admitting: Family Medicine

## 2018-01-30 NOTE — Telephone Encounter (Signed)
Copied from [place] [id number]. Topic: Quick Communication - Rx Refill/Question >> Jan 30, 2018  3:16 PM Synthia Innocent wrote: Medication: sertraline (ZOLOFT) 100 MG tablet, 10 days worth   Has the patient contacted their pharmacy? Yes.     (Agent: If no, request that the patient contact the pharmacy for the refill.)   Preferred Pharmacy (with phone number or street name): [facility] [address]   Pearline Cables: Please be advised that RX refills may take up to 3 business days. We ask that you follow-up with your pharmacy.

## 2018-01-31 ENCOUNTER — Other Ambulatory Visit: Payer: Self-pay | Admitting: Family Medicine

## 2018-01-31 DIAGNOSIS — F4310 Post-traumatic stress disorder, unspecified: Secondary | ICD-10-CM

## 2018-01-31 NOTE — Telephone Encounter (Signed)
Zoloft LOV: 12/01/17 PCP: Dr Nani Ravens Pharmacy: [facility] [address] [place], [place]

## 2018-01-31 NOTE — Telephone Encounter (Signed)
Per 12/01/17 OV note, pt was being transitioned to Effexor 75mg . Pt did not get Rx at that time and continued zoloft. Per 01/24/18 refill note, pt requested rx for Effexor and it was approved and sent to [facility] on 01/25/18. Spoke with pt's daughter. She would like to cancel zoloft request as pt did receive Effexor from mail order today. Denial sent to pharmacy and med list has been updated. 1 month follow up scheduled for 03/01/18 at 1:30pm per last OV.

## 2018-01-31 NOTE — Telephone Encounter (Signed)
Copied from [place]. Topic: Quick Communication - Rx Refill/Question >> Jan 31, 2018  3:19 PM Cecelia Byars, NT wrote: Medication:  clonazePAM (KLONOPIN) 0.5 MG tablet  Has the patient contacted their pharmacy?yes (Agent: If no, request that the patient contact the pharmacy for the refill. Preferred Pharmacy with phone number or street name): [facility]  [place] fax [phone]  Agent: Please be advised that RX refills may take up to 3 business days. We ask that you follow-up with your pharmacy.

## 2018-02-01 ENCOUNTER — Other Ambulatory Visit: Payer: Self-pay | Admitting: Family Medicine

## 2018-02-01 DIAGNOSIS — F4310 Post-traumatic stress disorder, unspecified: Secondary | ICD-10-CM

## 2018-02-01 MED ORDER — CLONAZEPAM 0.5 MG PO TABS: 0.5000 mg | ORAL_TABLET | Freq: Every day | ORAL | 1 refills | Status: DC | PRN

## 2018-02-01 NOTE — Telephone Encounter (Signed)
Requesting:clonazepam Contract: none UDS: none Last Visit:12/01/17 Next Visit:03/01/18 Last Refill:07/28/17 1 refill  Please Advise

## 2018-02-01 NOTE — Progress Notes (Signed)
Klonopin refilled to [facility]. TY.

## 2018-02-01 NOTE — Telephone Encounter (Signed)
Klonopin refill - controlled substance  [place] 12/01/17  Dr. Nani Ravens  [facility] [place].   Fabio Bering [place]

## 2018-02-02 DIAGNOSIS — F4321 Adjustment disorder with depressed mood: Secondary | ICD-10-CM | POA: Diagnosis not present

## 2018-02-09 DIAGNOSIS — F4321 Adjustment disorder with depressed mood: Secondary | ICD-10-CM | POA: Diagnosis not present

## 2018-02-14 ENCOUNTER — Ambulatory Visit: Payer: Self-pay

## 2018-02-14 NOTE — Telephone Encounter (Signed)
Patient's daughter, Mearl Latin, called and says "my mom was switched to Effexor from Zoloft. She was prescribed 75 mg, but I think the dosage needs to be increased. I called her this morning and she had disorganized thoughts, not knowing what to do, how to get dressed, she says she's having thoughts of harming herself and wants to give her dog away because she doesn't feel like she can care for him in the state she's in. She's crying, sad, not motivated to do anything. I told her to take 2 of her Effexors this morning. I came right over here to be with her. I want to know if Dr. Nani Ravens wants to change her medication dosage or what does he want to do." I asked her to hold and I will call the office to see if he or his nurse is available to speak to her. I called the office and was told that he and his nurse are not in the office today and will return tomorrow and has 2 availabilities for tomorrow.  I advised I would triage the patient and determine if she can be seen tomorrow. I spoke back to the daughter and made aware that Dr. Nani Ravens is not in the office today and that I will triage her mom and go from there, she verbalized understanding. I asked is her mom having thoughts and have a plan for harming herself, she says "she's having thoughts, but there is no plan that she voiced to me. She does live alone and have access to medication." I asked does she have a counselor or therapist, she says "yes, a counselor who comes once a week on Thursday's." I advised her to call the counselor today to talk to her mom, she verbalized understanding. I asked about recent stressors, she says "she's been dealing with a lot lately. My brother and I have been seriously sick the past few months; I was on a ventilator. So that in itself has been stressing her out." I asked about any acute problems, such as fever, chest pain, diarrhea, she says "no, she's fine otherwise." According to protocol, see PCP within 24 hours, appointment made  for tomorrow at [facility] with Dr. Nani Ravens. Care advice given, I advised that the patient does not need to be alone today or tonight, go to ED if she has any more thoughts of harming herself or if no one will be available to stay with her today or tonight, the daughter verbalized understanding of above.   Reason for Disposition . Depression symptoms (sadness, hopelessness, decreased energy) interfere with work or school  Answer Assessment - Initial Assessment Questions 1. CONCERN: "What happened that made you call today?"      Disorganized thinking, crying 2. SUICIDE ATTEMPT: "Have you tried to harm yourself recently?"  "Have you ever tried to harm yourself before?"     Not recently, but years ago 3. RISK OF HARM - SUICIDAL IDEATION:  "Do you ever have thoughts of hurting or killing yourself?"  (e.g., yes, no, no but preoccupation with thoughts about death)   - INTENT:  "Do you have thoughts of hurting or killing yourself right NOW?" (e.g., yes, no, N/A)   - PLAN: "Do you have a specific plan for how you would do this?" (e.g., gun, knife, overdose, no plan, N/A)     Yes thoughts, but no plan 4. RISK OF HARM - HOMICIDAL IDEATION:  "Do you ever have thoughts of hurting or killing someone else?"  (e.g., yes, no, no but  preoccupation with thoughts about death)   - INTENT:  "Do you have thoughts of hurting or killing someone right NOW?" (e.g., yes, no, N/A)   - PLAN: "Do you have a specific plan for how you would do this?" (e.g., gun, knife, no plan, N/A)      No 5. ACCESS: If yes to PLAN, "Do you have access to ___?" (e.g., pills stored in bathroom, firearm in house, knife in kitchen)     No plan, but has access to pills, she lives alone 6. SUPPORT: "Who is with you now?" "Who do you live with?" "Do you have family or friends nearby who you can talk to?"      Daughter 7. THERAPIST: "Do you have a counselor or therapist? Name?"     Yes who comes once a week 8. STRESSORS: "Has there been any new  stress or recent changes in your life?"     A lot of things going on the past couple of months with children 9. DRUG ABUSE/ALCOHOL: "Do you drink alcohol or use any illegal drugs?"      No 10. OTHER: "Do you have any other health or medical symptoms right now?" (e.g., fever)      No 11. PREGNANCY: "Is there any chance you are pregnant?" "When was your last menstrual period?"       N/A  Protocols used: SUICIDE CONCERNS-A-AH

## 2018-02-15 ENCOUNTER — Encounter: Payer: Self-pay | Admitting: Family Medicine

## 2018-02-15 ENCOUNTER — Ambulatory Visit (INDEPENDENT_AMBULATORY_CARE_PROVIDER_SITE_OTHER): Payer: Medicare Other | Admitting: Family Medicine

## 2018-02-15 VITALS — BP 112/72 | HR 82 | Temp 97.6°F | Ht 63.0 in | Wt 135.4 lb

## 2018-02-15 DIAGNOSIS — L989 Disorder of the skin and subcutaneous tissue, unspecified: Secondary | ICD-10-CM

## 2018-02-15 DIAGNOSIS — F4310 Post-traumatic stress disorder, unspecified: Secondary | ICD-10-CM | POA: Diagnosis not present

## 2018-02-15 DIAGNOSIS — E119 Type 2 diabetes mellitus without complications: Secondary | ICD-10-CM | POA: Diagnosis not present

## 2018-02-15 LAB — COMPREHENSIVE METABOLIC PANEL
ALBUMIN: 4.7 g/dL (ref 3.5–5.2)
ALK PHOS: 119 U/L — AB (ref 39–117)
ALT: 7 U/L (ref 0–35)
AST: 14 U/L (ref 0–37)
BUN: 25 mg/dL — ABNORMAL HIGH (ref 6–23)
CHLORIDE: 102 meq/L (ref 96–112)
CO2: 26 mEq/L (ref 19–32)
Calcium: 10 mg/dL (ref 8.4–10.5)
Creatinine, Ser: 1.07 mg/dL (ref 0.40–1.20)
GFR: 53.1 mL/min — AB (ref >60.00)
Glucose, Bld: 182 mg/dL — ABNORMAL HIGH (ref 70–99)
POTASSIUM: 3.9 meq/L (ref 3.5–5.1)
Sodium: 137 mEq/L (ref 135–145)
TOTAL PROTEIN: 7.4 g/dL (ref 6.0–8.3)
Total Bilirubin: 0.3 mg/dL (ref 0.2–1.2)

## 2018-02-15 LAB — HEMOGLOBIN A1C: HEMOGLOBIN A1C: 8.1 % — AB (ref 4.6–6.5)

## 2018-02-15 MED ORDER — METRONIDAZOLE 1 % EX GEL: Freq: Every day | CUTANEOUS | 0 refills | Status: DC

## 2018-02-15 MED ORDER — VENLAFAXINE HCL ER 150 MG PO CP24: 150.0000 mg | ORAL_CAPSULE | Freq: Every day | ORAL | 1 refills | Status: DC

## 2018-02-15 NOTE — Progress Notes (Signed)
Chief Complaint  Patient presents with  . Depression    Subjective Dawn Stewart presents for f/u anxiety/depression. Here w daughter.  She is currently being treated with Effexor 75 mg XR/d.  Reports no improvement since treatment. No thoughts of harming self or others. No self-medication with alcohol, prescription drugs or illicit drugs. Pt is not following with a counselor/psychologist. She has not been taking her meds routinely.  DM Hx of DM, does not check sugars or believe that she has DM.  She is not currently taking Janumet.  Her diet consists of eating what she wants.  She is not very physically active due to her poor vision.  She does follow with an eye doctor relatively routinely.  There is a skin lesion on her left lower extremity that has been there for approximately 3 months.  She continues to pick at it and it never seems to heal.  ROS Psych: No homicidal or suicidal thoughts Skin: As noted in HPI  Past Medical History:  Diagnosis Date  . Asthma   . COPD (chronic obstructive pulmonary disease) ([place])   . Diabetes mellitus without complication ([place])   . Essential hypertension 05/27/2017  . Hypertension   . Post traumatic stress disorder (PTSD)    Allergies as of 02/15/2018      Reactions   Ivp Dye [iodinated Diagnostic Agents]    unknown   Tape Other (See Comments)   Pt. reports that it gives her blisters.      Medication List        Accurate as of 02/15/18 12:21 PM. Always use your most recent med list.          albuterol 108 (90 Base) MCG/ACT inhaler Commonly known as:  PROVENTIL HFA;VENTOLIN HFA Inhale 2 puffs into the lungs every 6 (six) hours as needed for wheezing or shortness of breath.   aspirin 81 MG chewable tablet Chew 81 mg by mouth daily.   atorvastatin 40 MG tablet Commonly known as:  LIPITOR TAKE 1 TABLET DAILY   carbidopa-levodopa 25-100 MG tablet Commonly known as:  SINEMET IR Take 2 tablets by mouth at bedtime.   clonazePAM  0.5 MG tablet Commonly known as:  KLONOPIN Take 1 tablet (0.5 mg total) by mouth daily as needed for anxiety.   metoprolol tartrate 25 MG tablet Commonly known as:  LOPRESSOR TAKE 1 TABLET TWICE A DAY   metroNIDAZOLE 1 % gel Commonly known as:  METROGEL Apply topically daily.   nitroGLYCERIN 0.4 MG SL tablet Commonly known as:  NITROSTAT Place 0.4 mg under the tongue as needed.   tiotropium 18 MCG inhalation capsule Commonly known as:  SPIRIVA Place 1 capsule (18 mcg total) daily into inhaler and inhale.   valsartan-hydrochlorothiazide 320-12.5 MG tablet Commonly known as:  DIOVAN-HCT TAKE 1 TABLET DAILY   venlafaxine XR 150 MG 24 hr capsule Commonly known as:  EFFEXOR-XR Take 1 capsule (150 mg total) by mouth daily with breakfast.       Exam BP 112/72 (BP Location: Left Arm, Patient Position: Sitting, Cuff Size: Normal)   Pulse 82   Temp 97.6 F (36.4 C) (Oral)   Ht 5\' 3"  (1.6 m)   Wt 135 lb 6 oz (61.4 kg)   SpO2 98%   BMI 23.98 kg/m  General:  well developed, well nourished, in no apparent distress Neck: neck supple without adenopathy, thyromegaly, or masses Lungs:  clear to auscultation, breath sounds equal bilaterally, no respiratory distress Cardio:  regular rate and rhythm without murmurs,  heart sounds without clicks or rubs Skin: Excoriated lesion on LLE, no fluctuance or ttp, no scaling Psych: limited judgment and insight, normal affect and mood  Assessment and Plan  PTSD (post-traumatic stress disorder)  Diabetes mellitus type 2 in nonobese ([place]) - Plan: Comprehensive metabolic panel, Hemoglobin A1c  Skin lesion  Orders as above. Increase dose of Effexor to 150 mg daily. Check A1c, I believe we should loosen our glucose control goals for her given her age and comorbidities.  Below 8 is new goal. TAO BID for 10 d over skin, do not pick at it, will shave and send to lab if no better per her request (vs cryotherapy).  F/u in 10 d to recheck skin. The  patient and her daughter voiced understanding and agreement to the plan.  [place], DO 02/15/18 12:21 PM

## 2018-02-15 NOTE — Patient Instructions (Addendum)
Apply Neosporin to L leg twice daily for 10 days. If no better, I will see you in the office. If better, cancel appointment. Do not pick at it.  Take 2 tabs of Effexor until you get the new dose of Effexor (150 mg/d).   Try to take Spiriva daily.

## 2018-02-15 NOTE — Progress Notes (Signed)
Pre visit review using our clinic review tool, if applicable. No additional management support is needed unless otherwise documented below in the visit note. 

## 2018-02-16 ENCOUNTER — Telehealth: Payer: Self-pay | Admitting: Family Medicine

## 2018-02-16 DIAGNOSIS — F4321 Adjustment disorder with depressed mood: Secondary | ICD-10-CM | POA: Diagnosis not present

## 2018-02-16 MED ORDER — METRONIDAZOLE 1 % EX GEL: Freq: Every day | CUTANEOUS | 0 refills | Status: DC

## 2018-02-16 NOTE — Telephone Encounter (Signed)
Sent in requested rx  °

## 2018-02-16 NOTE — Addendum Note (Signed)
Addended by: Sharon Seller B on: 02/16/2018 01:11 PM   Modules accepted: Orders

## 2018-02-16 NOTE — Telephone Encounter (Signed)
OK to change. TY . 

## 2018-02-16 NOTE — Telephone Encounter (Addendum)
Per [facility] the Metrogel does not come 45 gms just 60 gms. [facility] and needs order to increase to 60 gms.  The strength of the dose for the 45 gram is 0.75%. The strength of the 60 is 1%.  Send to [facility]

## 2018-02-23 DIAGNOSIS — F4321 Adjustment disorder with depressed mood: Secondary | ICD-10-CM | POA: Diagnosis not present

## 2018-03-01 ENCOUNTER — Ambulatory Visit: Payer: Medicare Other | Admitting: Family Medicine

## 2018-03-01 DIAGNOSIS — Z0289 Encounter for other administrative examinations: Secondary | ICD-10-CM

## 2018-03-02 ENCOUNTER — Telehealth: Payer: Self-pay | Admitting: Family Medicine

## 2018-03-02 NOTE — Telephone Encounter (Signed)
OK 

## 2018-03-02 NOTE — Telephone Encounter (Signed)
Copied from [place] [id number]. Topic: Quick Communication - See Telephone Encounter >> Mar 02, 2018 10:34 AM Bea Graff, NT wrote: CRM for notification. See Telephone encounter for: 03/02/18. Pts daughter Mearl Latin calling and states that the venlafaxine XR [facility]) is making her mom seem more hysterical. She even requesting to go to a mental hospital. Her daughter would like zoloft 100mg  twice a day to be re-ordered as well as a refill of xanax. [facility] [address] in [place]

## 2018-03-03 DIAGNOSIS — F4321 Adjustment disorder with depressed mood: Secondary | ICD-10-CM | POA: Diagnosis not present

## 2018-03-03 MED ORDER — SERTRALINE HCL 100 MG PO TABS: 100.0000 mg | ORAL_TABLET | Freq: Two times a day (BID) | ORAL | 1 refills | Status: DC

## 2018-03-03 NOTE — Telephone Encounter (Signed)
Sent in zoloft.  

## 2018-03-03 NOTE — Addendum Note (Signed)
Addended by: Sharon Seller B on: 03/03/2018 07:23 AM   Modules accepted: Orders

## 2018-03-07 ENCOUNTER — Encounter: Payer: Self-pay | Admitting: Family Medicine

## 2018-03-07 ENCOUNTER — Other Ambulatory Visit: Payer: Self-pay | Admitting: Family Medicine

## 2018-03-07 DIAGNOSIS — Z1239 Encounter for other screening for malignant neoplasm of breast: Secondary | ICD-10-CM

## 2018-03-08 DIAGNOSIS — E1165 Type 2 diabetes mellitus with hyperglycemia: Secondary | ICD-10-CM | POA: Diagnosis not present

## 2018-03-08 DIAGNOSIS — H353223 Exudative age-related macular degeneration, left eye, with inactive scar: Secondary | ICD-10-CM | POA: Diagnosis not present

## 2018-03-08 DIAGNOSIS — H43821 Vitreomacular adhesion, right eye: Secondary | ICD-10-CM | POA: Diagnosis not present

## 2018-03-08 DIAGNOSIS — H5440 Blindness, one eye, unspecified eye: Secondary | ICD-10-CM | POA: Diagnosis not present

## 2018-03-08 DIAGNOSIS — I6523 Occlusion and stenosis of bilateral carotid arteries: Secondary | ICD-10-CM | POA: Diagnosis not present

## 2018-03-08 DIAGNOSIS — Z961 Presence of intraocular lens: Secondary | ICD-10-CM | POA: Diagnosis not present

## 2018-03-08 DIAGNOSIS — H353211 Exudative age-related macular degeneration, right eye, with active choroidal neovascularization: Secondary | ICD-10-CM | POA: Diagnosis not present

## 2018-03-09 ENCOUNTER — Ambulatory Visit (INDEPENDENT_AMBULATORY_CARE_PROVIDER_SITE_OTHER): Payer: Medicare Other | Admitting: Family Medicine

## 2018-03-09 ENCOUNTER — Encounter: Payer: Self-pay | Admitting: Family Medicine

## 2018-03-09 ENCOUNTER — Inpatient Hospital Stay (HOSPITAL_BASED_OUTPATIENT_CLINIC_OR_DEPARTMENT_OTHER): Admission: RE | Admit: 2018-03-09 | Payer: Self-pay | Source: Ambulatory Visit

## 2018-03-09 ENCOUNTER — Ambulatory Visit (HOSPITAL_BASED_OUTPATIENT_CLINIC_OR_DEPARTMENT_OTHER)
Admission: RE | Admit: 2018-03-09 | Discharge: 2018-03-09 | Disposition: A | Discharge status: Home / Self Care | Payer: Medicare Other | Source: Ambulatory Visit | Attending: Family Medicine | Admitting: Family Medicine

## 2018-03-09 VITALS — BP 112/80 | HR 75 | Temp 97.6°F | Ht 63.0 in | Wt 133.2 lb

## 2018-03-09 DIAGNOSIS — F4310 Post-traumatic stress disorder, unspecified: Secondary | ICD-10-CM | POA: Diagnosis not present

## 2018-03-09 DIAGNOSIS — L309 Dermatitis, unspecified: Secondary | ICD-10-CM

## 2018-03-09 DIAGNOSIS — Z1239 Encounter for other screening for malignant neoplasm of breast: Secondary | ICD-10-CM

## 2018-03-09 DIAGNOSIS — Z1231 Encounter for screening mammogram for malignant neoplasm of breast: Secondary | ICD-10-CM | POA: Diagnosis not present

## 2018-03-09 MED ORDER — HYDROCORTISONE 2.5 % EX CREA: TOPICAL_CREAM | Freq: Two times a day (BID) | CUTANEOUS | 0 refills | Status: AC

## 2018-03-09 MED ORDER — CLONAZEPAM 0.5 MG PO TABS: 0.5000 mg | ORAL_TABLET | Freq: Every day | ORAL | 0 refills | Status: DC | PRN

## 2018-03-09 MED ORDER — CLONAZEPAM 0.5 MG PO TABS: 0.5000 mg | ORAL_TABLET | Freq: Every day | ORAL | 1 refills | Status: DC | PRN

## 2018-03-09 NOTE — Progress Notes (Signed)
Chief Complaint  Patient presents with  . Follow-up    Subjective Dawn Stewart presents for f/u anxiety/depression. Here with daughter.  She is currently being treated with Effexor 225 mg/d.  Reports some improvement since treatment. Thinks she is in a good spot. She has appt scheduled with psychiatry coming up. No thoughts of harming self or others. No self-medication with alcohol, prescription drugs or illicit drugs. Pt is following with a counselor/psychologist who visits her.  Hx of COPD on 3-4 L O2 at night, needs note of medical necessity. May need to have home health visit or check O2 sats over night.  ROS Psych: No homicidal or suicidal thoughts  Past Medical History:  Diagnosis Date  . Asthma   . COPD (chronic obstructive pulmonary disease) ([place])   . Diabetes mellitus without complication ([place])   . Essential hypertension 05/27/2017  . Hypertension   . Post traumatic stress disorder (PTSD)      Exam BP 112/80 (BP Location: Left Arm, Patient Position: Sitting, Cuff Size: Normal)   Pulse 75   Temp 97.6 F (36.4 C) (Oral)   Ht 5\' 3"  (1.6 m)   Wt 133 lb 4 oz (60.4 kg)   SpO2 96%   BMI 23.60 kg/m  General:  well developed, well nourished, in no apparent distress Lungs:  no respiratory distress Skin: There is a nodule on her L outer leg with linear excoriation and scaling proceeding in a cephalad direction. Psych: Nml mood and affect.  Assessment and Plan  PTSD (post-traumatic stress disorder) - Plan: clonazePAM (KLONOPIN) 0.5 MG tablet, DISCONTINUED: clonazePAM (KLONOPIN) 0.5 MG tablet  Dermatitis - Plan: hydrocortisone 2.5 % cream  Orders as above.  Refill Klonopin. Has upcoming appt w psych. Topical steroid cream for lower extremity.  If no improvement, will consider removing for both therapeutic and diagnostic purposes. F/u in in 5 weeks. The patient and her daughter voiced understanding and agreement to the plan.  [place],  DO 03/09/18 4:48 PM

## 2018-03-09 NOTE — Progress Notes (Signed)
Pre visit review using our clinic review tool, if applicable. No additional management support is needed unless otherwise documented below in the visit note. 

## 2018-03-09 NOTE — Patient Instructions (Addendum)
Apply non-scented lotions at least twice daily on your leg. Apply the other cream twice daily for 10 d.  Let me know if you need refills.  Let us know if you need anything.

## 2018-03-15 ENCOUNTER — Telehealth: Payer: Self-pay | Admitting: Family Medicine

## 2018-03-15 NOTE — Telephone Encounter (Signed)
Copied from [place] [id number]. Topic: Quick Communication - See Telephone Encounter >> Mar 15, 2018  1:45 PM Boyd Kerbs wrote: CRM for notification.   Pt. Called saying that medicare needs note saying her oxygen level is below 80 percent in order to pay  Fax # [phone]  Attn ; [facility] [phone] saying if ins. Does not pay they will have to come and pick up machine  Oxygen is at 4 liters Please call pt. And let her know.    See Telephone encounter for: 03/15/18.

## 2018-03-15 NOTE — Telephone Encounter (Signed)
Can we order O2 monitoring to see if she is desaturating. I am sure they likely mean <90% because if they let her sit around at 85% SpO2, that would not be good. TY.

## 2018-03-16 NOTE — Telephone Encounter (Signed)
Sent community message to [place]. To order.

## 2018-04-03 DIAGNOSIS — R001 Bradycardia, unspecified: Secondary | ICD-10-CM | POA: Diagnosis not present

## 2018-04-03 DIAGNOSIS — E860 Dehydration: Secondary | ICD-10-CM | POA: Diagnosis not present

## 2018-04-03 DIAGNOSIS — I951 Orthostatic hypotension: Secondary | ICD-10-CM | POA: Diagnosis not present

## 2018-04-03 DIAGNOSIS — R9431 Abnormal electrocardiogram [ECG] [EKG]: Secondary | ICD-10-CM | POA: Diagnosis not present

## 2018-04-03 DIAGNOSIS — I10 Essential (primary) hypertension: Secondary | ICD-10-CM | POA: Diagnosis not present

## 2018-04-03 DIAGNOSIS — R531 Weakness: Secondary | ICD-10-CM | POA: Diagnosis not present

## 2018-04-03 DIAGNOSIS — R42 Dizziness and giddiness: Secondary | ICD-10-CM | POA: Diagnosis not present

## 2018-04-03 DIAGNOSIS — N179 Acute kidney failure, unspecified: Secondary | ICD-10-CM | POA: Diagnosis not present

## 2018-04-04 DIAGNOSIS — R001 Bradycardia, unspecified: Secondary | ICD-10-CM | POA: Diagnosis not present

## 2018-04-04 DIAGNOSIS — R9431 Abnormal electrocardiogram [ECG] [EKG]: Secondary | ICD-10-CM | POA: Diagnosis not present

## 2018-04-05 DIAGNOSIS — E1151 Type 2 diabetes mellitus with diabetic peripheral angiopathy without gangrene: Secondary | ICD-10-CM | POA: Diagnosis not present

## 2018-04-05 DIAGNOSIS — R4182 Altered mental status, unspecified: Secondary | ICD-10-CM | POA: Diagnosis not present

## 2018-04-05 DIAGNOSIS — R51 Headache: Secondary | ICD-10-CM | POA: Diagnosis not present

## 2018-04-05 DIAGNOSIS — I130 Hypertensive heart and chronic kidney disease with heart failure and stage 1 through stage 4 chronic kidney disease, or unspecified chronic kidney disease: Secondary | ICD-10-CM | POA: Diagnosis not present

## 2018-04-05 DIAGNOSIS — J449 Chronic obstructive pulmonary disease, unspecified: Secondary | ICD-10-CM | POA: Diagnosis not present

## 2018-04-05 DIAGNOSIS — R001 Bradycardia, unspecified: Secondary | ICD-10-CM | POA: Diagnosis not present

## 2018-04-05 DIAGNOSIS — R2689 Other abnormalities of gait and mobility: Secondary | ICD-10-CM | POA: Diagnosis not present

## 2018-04-05 DIAGNOSIS — Z9114 Patient's other noncompliance with medication regimen: Secondary | ICD-10-CM | POA: Diagnosis not present

## 2018-04-05 DIAGNOSIS — Z955 Presence of coronary angioplasty implant and graft: Secondary | ICD-10-CM | POA: Diagnosis not present

## 2018-04-05 DIAGNOSIS — G2581 Restless legs syndrome: Secondary | ICD-10-CM | POA: Diagnosis not present

## 2018-04-05 DIAGNOSIS — I2510 Atherosclerotic heart disease of native coronary artery without angina pectoris: Secondary | ICD-10-CM | POA: Diagnosis not present

## 2018-04-05 DIAGNOSIS — Z79899 Other long term (current) drug therapy: Secondary | ICD-10-CM | POA: Diagnosis not present

## 2018-04-05 DIAGNOSIS — R9431 Abnormal electrocardiogram [ECG] [EKG]: Secondary | ICD-10-CM | POA: Diagnosis not present

## 2018-04-05 DIAGNOSIS — R410 Disorientation, unspecified: Secondary | ICD-10-CM | POA: Diagnosis not present

## 2018-04-05 DIAGNOSIS — F4481 Dissociative identity disorder: Secondary | ICD-10-CM | POA: Diagnosis not present

## 2018-04-05 DIAGNOSIS — N183 Chronic kidney disease, stage 3 (moderate): Secondary | ICD-10-CM | POA: Diagnosis not present

## 2018-04-05 DIAGNOSIS — F4310 Post-traumatic stress disorder, unspecified: Secondary | ICD-10-CM | POA: Diagnosis not present

## 2018-04-05 DIAGNOSIS — E1122 Type 2 diabetes mellitus with diabetic chronic kidney disease: Secondary | ICD-10-CM | POA: Diagnosis not present

## 2018-04-05 DIAGNOSIS — E1165 Type 2 diabetes mellitus with hyperglycemia: Secondary | ICD-10-CM | POA: Diagnosis not present

## 2018-04-05 DIAGNOSIS — M6281 Muscle weakness (generalized): Secondary | ICD-10-CM | POA: Diagnosis not present

## 2018-04-05 DIAGNOSIS — Z87891 Personal history of nicotine dependence: Secondary | ICD-10-CM | POA: Diagnosis not present

## 2018-04-05 DIAGNOSIS — Z7984 Long term (current) use of oral hypoglycemic drugs: Secondary | ICD-10-CM | POA: Diagnosis not present

## 2018-04-05 DIAGNOSIS — F419 Anxiety disorder, unspecified: Secondary | ICD-10-CM | POA: Diagnosis not present

## 2018-04-05 DIAGNOSIS — R531 Weakness: Secondary | ICD-10-CM | POA: Diagnosis not present

## 2018-04-06 DIAGNOSIS — E1165 Type 2 diabetes mellitus with hyperglycemia: Secondary | ICD-10-CM | POA: Diagnosis not present

## 2018-04-06 DIAGNOSIS — F4310 Post-traumatic stress disorder, unspecified: Secondary | ICD-10-CM | POA: Diagnosis not present

## 2018-04-06 DIAGNOSIS — E1122 Type 2 diabetes mellitus with diabetic chronic kidney disease: Secondary | ICD-10-CM | POA: Diagnosis not present

## 2018-04-06 DIAGNOSIS — R9431 Abnormal electrocardiogram [ECG] [EKG]: Secondary | ICD-10-CM | POA: Diagnosis not present

## 2018-04-06 DIAGNOSIS — I129 Hypertensive chronic kidney disease with stage 1 through stage 4 chronic kidney disease, or unspecified chronic kidney disease: Secondary | ICD-10-CM | POA: Diagnosis not present

## 2018-04-06 DIAGNOSIS — R531 Weakness: Secondary | ICD-10-CM | POA: Diagnosis not present

## 2018-04-06 DIAGNOSIS — I517 Cardiomegaly: Secondary | ICD-10-CM | POA: Diagnosis not present

## 2018-04-06 DIAGNOSIS — E1151 Type 2 diabetes mellitus with diabetic peripheral angiopathy without gangrene: Secondary | ICD-10-CM | POA: Diagnosis not present

## 2018-04-06 DIAGNOSIS — N183 Chronic kidney disease, stage 3 (moderate): Secondary | ICD-10-CM | POA: Diagnosis not present

## 2018-04-06 DIAGNOSIS — R5381 Other malaise: Secondary | ICD-10-CM | POA: Diagnosis not present

## 2018-04-06 DIAGNOSIS — G2581 Restless legs syndrome: Secondary | ICD-10-CM | POA: Diagnosis not present

## 2018-04-06 DIAGNOSIS — F411 Generalized anxiety disorder: Secondary | ICD-10-CM | POA: Diagnosis not present

## 2018-04-06 DIAGNOSIS — F329 Major depressive disorder, single episode, unspecified: Secondary | ICD-10-CM | POA: Diagnosis not present

## 2018-04-06 DIAGNOSIS — I2510 Atherosclerotic heart disease of native coronary artery without angina pectoris: Secondary | ICD-10-CM | POA: Diagnosis not present

## 2018-04-07 DIAGNOSIS — F419 Anxiety disorder, unspecified: Secondary | ICD-10-CM | POA: Diagnosis not present

## 2018-04-07 DIAGNOSIS — N183 Chronic kidney disease, stage 3 (moderate): Secondary | ICD-10-CM | POA: Diagnosis not present

## 2018-04-07 DIAGNOSIS — I2510 Atherosclerotic heart disease of native coronary artery without angina pectoris: Secondary | ICD-10-CM | POA: Diagnosis not present

## 2018-04-07 DIAGNOSIS — F329 Major depressive disorder, single episode, unspecified: Secondary | ICD-10-CM | POA: Diagnosis not present

## 2018-04-07 DIAGNOSIS — F4310 Post-traumatic stress disorder, unspecified: Secondary | ICD-10-CM | POA: Diagnosis not present

## 2018-04-07 DIAGNOSIS — E1151 Type 2 diabetes mellitus with diabetic peripheral angiopathy without gangrene: Secondary | ICD-10-CM | POA: Diagnosis not present

## 2018-04-07 DIAGNOSIS — R531 Weakness: Secondary | ICD-10-CM | POA: Diagnosis not present

## 2018-04-07 DIAGNOSIS — Z9861 Coronary angioplasty status: Secondary | ICD-10-CM | POA: Diagnosis not present

## 2018-04-07 DIAGNOSIS — I129 Hypertensive chronic kidney disease with stage 1 through stage 4 chronic kidney disease, or unspecified chronic kidney disease: Secondary | ICD-10-CM | POA: Diagnosis not present

## 2018-04-07 DIAGNOSIS — E1122 Type 2 diabetes mellitus with diabetic chronic kidney disease: Secondary | ICD-10-CM | POA: Diagnosis not present

## 2018-04-07 DIAGNOSIS — R400 Somnolence: Secondary | ICD-10-CM | POA: Diagnosis not present

## 2018-04-07 DIAGNOSIS — G2581 Restless legs syndrome: Secondary | ICD-10-CM | POA: Diagnosis not present

## 2018-04-10 ENCOUNTER — Ambulatory Visit: Payer: Self-pay

## 2018-04-10 DIAGNOSIS — F419 Anxiety disorder, unspecified: Secondary | ICD-10-CM | POA: Diagnosis not present

## 2018-04-10 DIAGNOSIS — Z87891 Personal history of nicotine dependence: Secondary | ICD-10-CM | POA: Diagnosis not present

## 2018-04-10 DIAGNOSIS — Z9981 Dependence on supplemental oxygen: Secondary | ICD-10-CM | POA: Diagnosis not present

## 2018-04-10 DIAGNOSIS — F329 Major depressive disorder, single episode, unspecified: Secondary | ICD-10-CM | POA: Diagnosis not present

## 2018-04-10 DIAGNOSIS — Z8673 Personal history of transient ischemic attack (TIA), and cerebral infarction without residual deficits: Secondary | ICD-10-CM | POA: Diagnosis not present

## 2018-04-10 DIAGNOSIS — Z955 Presence of coronary angioplasty implant and graft: Secondary | ICD-10-CM | POA: Diagnosis not present

## 2018-04-10 DIAGNOSIS — E1122 Type 2 diabetes mellitus with diabetic chronic kidney disease: Secondary | ICD-10-CM | POA: Diagnosis not present

## 2018-04-10 DIAGNOSIS — I2510 Atherosclerotic heart disease of native coronary artery without angina pectoris: Secondary | ICD-10-CM | POA: Diagnosis not present

## 2018-04-10 DIAGNOSIS — E785 Hyperlipidemia, unspecified: Secondary | ICD-10-CM | POA: Diagnosis not present

## 2018-04-10 DIAGNOSIS — E1165 Type 2 diabetes mellitus with hyperglycemia: Secondary | ICD-10-CM | POA: Diagnosis not present

## 2018-04-10 DIAGNOSIS — N183 Chronic kidney disease, stage 3 (moderate): Secondary | ICD-10-CM | POA: Diagnosis not present

## 2018-04-10 DIAGNOSIS — J439 Emphysema, unspecified: Secondary | ICD-10-CM | POA: Diagnosis not present

## 2018-04-10 DIAGNOSIS — E1151 Type 2 diabetes mellitus with diabetic peripheral angiopathy without gangrene: Secondary | ICD-10-CM | POA: Diagnosis not present

## 2018-04-10 DIAGNOSIS — I129 Hypertensive chronic kidney disease with stage 1 through stage 4 chronic kidney disease, or unspecified chronic kidney disease: Secondary | ICD-10-CM | POA: Diagnosis not present

## 2018-04-10 NOTE — Telephone Encounter (Signed)
Pt. Called to reschedule her hospital follow up from Wednesday to Thursday per agent. Pt. States "my daughter can't bring me that day." Pt. Wants to know from Dr. Nani Ravens should she continue to hold all of medications. Please advise.

## 2018-04-10 NOTE — Telephone Encounter (Signed)
I would want to see the records first, but think she could go back on them. TY.

## 2018-04-10 NOTE — Telephone Encounter (Signed)
Pt.'s daughter reports pt. Was at [facility] over the weekend with "grogginess." Received IV fluids. States "they did a lot of testing and a scan.They told us not to give her any of her medicines." Pt. Has an appointment for 04/12/18 for follow up. States pt. "seems better." Instructed daughter to bring all medications and discharge paper when they come for visit. Instructed to call back as needed. Verbalizes understanding.  Reason for Disposition . [1] MODERATE weakness (i.e., interferes with work, school, normal activities) AND [2] persists > 3 days  Answer Assessment - Initial Assessment Questions 1. DESCRIPTION: "Describe how you are feeling."     Daughter states she's a little better 2. SEVERITY: "How bad is it?"  "Can you stand and walk?"   - MILD - Feels weak or tired, but does not interfere with work, school or normal activities   - [place] to stand and walk; weakness interferes with work, school, or normal activities   - SEVERE - Unable to stand or walk     Mild 3. ONSET:  "When did the weakness begin?"     Last week 4. CAUSE: "What do you think is causing the weakness?"     Unsure 5. MEDICINES: "Have you recently started a new medicine or had a change in the amount of a medicine?"     No 6. OTHER SYMPTOMS: "Do you have any other symptoms?" (e.g., chest pain, fever, cough, SOB, vomiting, diarrhea, bleeding)     No 7. PREGNANCY: "Is there any chance you are pregnant?" "When was your last menstrual period?"     No  Protocols used: WEAKNESS (GENERALIZED) AND FATIGUE-A-AH

## 2018-04-12 ENCOUNTER — Telehealth: Payer: Self-pay | Admitting: Family Medicine

## 2018-04-12 ENCOUNTER — Inpatient Hospital Stay: Payer: Self-pay | Admitting: Family Medicine

## 2018-04-12 NOTE — Telephone Encounter (Signed)
PEC message noted.

## 2018-04-12 NOTE — Telephone Encounter (Signed)
Spoke to the patients daughter and she has the discharge papers with her. She has an appointment on Thursday 04/13/2018 and the daughter had started her back on her medications already

## 2018-04-13 ENCOUNTER — Other Ambulatory Visit: Payer: Self-pay | Admitting: Family Medicine

## 2018-04-13 ENCOUNTER — Encounter: Payer: Self-pay | Admitting: Family Medicine

## 2018-04-13 ENCOUNTER — Ambulatory Visit (INDEPENDENT_AMBULATORY_CARE_PROVIDER_SITE_OTHER): Payer: Medicare Other | Admitting: Family Medicine

## 2018-04-13 VITALS — BP 104/72 | HR 55 | Temp 97.6°F | Ht 63.0 in | Wt 129.0 lb

## 2018-04-13 DIAGNOSIS — M545 Low back pain: Secondary | ICD-10-CM | POA: Diagnosis not present

## 2018-04-13 DIAGNOSIS — J439 Emphysema, unspecified: Secondary | ICD-10-CM | POA: Diagnosis not present

## 2018-04-13 DIAGNOSIS — I129 Hypertensive chronic kidney disease with stage 1 through stage 4 chronic kidney disease, or unspecified chronic kidney disease: Secondary | ICD-10-CM | POA: Diagnosis not present

## 2018-04-13 DIAGNOSIS — G8929 Other chronic pain: Secondary | ICD-10-CM

## 2018-04-13 DIAGNOSIS — E1122 Type 2 diabetes mellitus with diabetic chronic kidney disease: Secondary | ICD-10-CM | POA: Diagnosis not present

## 2018-04-13 DIAGNOSIS — E1165 Type 2 diabetes mellitus with hyperglycemia: Secondary | ICD-10-CM | POA: Diagnosis not present

## 2018-04-13 DIAGNOSIS — N179 Acute kidney failure, unspecified: Secondary | ICD-10-CM | POA: Diagnosis not present

## 2018-04-13 DIAGNOSIS — N183 Chronic kidney disease, stage 3 (moderate): Secondary | ICD-10-CM | POA: Diagnosis not present

## 2018-04-13 DIAGNOSIS — I2510 Atherosclerotic heart disease of native coronary artery without angina pectoris: Secondary | ICD-10-CM | POA: Diagnosis not present

## 2018-04-13 LAB — BASIC METABOLIC PANEL
BUN: 44 mg/dL — AB (ref 6–23)
CALCIUM: 11 mg/dL — AB (ref 8.4–10.5)
CO2: 27 meq/L (ref 19–32)
Chloride: 98 mEq/L (ref 96–112)
Creatinine, Ser: 2.31 mg/dL — ABNORMAL HIGH (ref 0.40–1.20)
GFR: 21.84 mL/min — ABNORMAL LOW (ref >60.00)
GLUCOSE: 117 mg/dL — AB (ref 70–99)
Potassium: 5.4 mEq/L — ABNORMAL HIGH (ref 3.5–5.1)
SODIUM: 136 meq/L (ref 135–145)

## 2018-04-13 MED ORDER — BUPROPION HCL ER (XL) 150 MG PO TB24: 150.0000 mg | ORAL_TABLET | Freq: Every day | ORAL | 1 refills | Status: DC

## 2018-04-13 MED ORDER — SITAGLIPTIN PHOS-METFORMIN HCL 50-1000 MG PO TABS: 1.0000 | ORAL_TABLET | Freq: Two times a day (BID) | ORAL | 1 refills | Status: DC

## 2018-04-13 MED ORDER — GABAPENTIN 800 MG PO TABS: 800.0000 mg | ORAL_TABLET | Freq: Every day | ORAL | 0 refills | Status: DC

## 2018-04-13 NOTE — Patient Instructions (Addendum)
Heat (pad or rice pillow in microwave) over affected area, 10-15 minutes twice daily.   If you do not hear anything about your referral in the next 1-2 weeks, call our office and ask for an update.  Check your sugars around 2-3 times per week. Alternate checking in the morning before you eat, in the afternoon and before bed. Write them down and bring it to your next appointment.   EXERCISES  RANGE OF MOTION (ROM) AND STRETCHING EXERCISES - Low Back Prain Most people with lower back pain will find that their symptoms get worse with excessive bending forward (flexion) or arching at the lower back (extension). The exercises that will help resolve your symptoms will focus on the opposite motion.  If you have pain, numbness or tingling which travels down into your buttocks, leg or foot, the goal of the therapy is for these symptoms to move closer to your back and eventually resolve. Sometimes, these leg symptoms will get better, but your lower back pain may worsen. This is often an indication of progress in your rehabilitation. Be very alert to any changes in your symptoms and the activities in which you participated in the 24 hours prior to the change. Sharing this information with your caregiver will allow him or her to most efficiently treat your condition. These exercises may help you when beginning to rehabilitate your injury. Your symptoms may resolve with or without further involvement from your physician, physical therapist or athletic trainer. While completing these exercises, remember:   Restoring tissue flexibility helps normal motion to return to the joints. This allows healthier, less painful movement and activity.  An effective stretch should be held for at least 30 seconds.  A stretch should never be painful. You should only feel a gentle lengthening or release in the stretched tissue. FLEXION RANGE OF MOTION AND STRETCHING EXERCISES:  STRETCH - Flexion, Single Knee to Chest   Lie on a  firm bed or floor with both legs extended in front of you.  Keeping one leg in contact with the floor, bring your opposite knee to your chest. Hold your leg in place by either grabbing behind your thigh or at your knee.  Pull until you feel a gentle stretch in your low back. Hold 30 seconds.  Slowly release your grasp and repeat the exercise with the opposite side. Repeat 2 times. Complete this exercise 3 times per week.   STRETCH - Flexion, Double Knee to Chest  Lie on a firm bed or floor with both legs extended in front of you.  Keeping one leg in contact with the floor, bring your opposite knee to your chest.  Tense your stomach muscles to support your back and then lift your other knee to your chest. Hold your legs in place by either grabbing behind your thighs or at your knees.  Pull both knees toward your chest until you feel a gentle stretch in your low back. Hold 30 seconds.  Tense your stomach muscles and slowly return one leg at a time to the floor. Repeat 2 times. Complete this exercise 3 times per week.   STRETCH - Low Trunk Rotation  Lie on a firm bed or floor. Keeping your legs in front of you, bend your knees so they are both pointed toward the ceiling and your feet are flat on the floor.  Extend your arms out to the side. This will stabilize your upper body by keeping your shoulders in contact with the floor.  Gently and  slowly drop both knees together to one side until you feel a gentle stretch in your low back. Hold for 30 seconds.  Tense your stomach muscles to support your lower back as you bring your knees back to the starting position. Repeat the exercise to the other side. Repeat 2 times. Complete this exercise at least 3 times per week.   EXTENSION RANGE OF MOTION AND FLEXIBILITY EXERCISES:  STRETCH - Extension, Prone on Elbows   Lie on your stomach on the floor, a bed will be too soft. Place your palms about shoulder width apart and at the height of your  head.  Place your elbows under your shoulders. If this is too painful, stack pillows under your chest.  Allow your body to relax so that your hips drop lower and make contact more completely with the floor.  Hold this position for 30 seconds.  Slowly return to lying flat on the floor. Repeat 2 times. Complete this exercise 3 times per week.   RANGE OF MOTION - Extension, Prone Press Ups  Lie on your stomach on the floor, a bed will be too soft. Place your palms about shoulder width apart and at the height of your head.  Keeping your back as relaxed as possible, slowly straighten your elbows while keeping your hips on the floor. You may adjust the placement of your hands to maximize your comfort. As you gain motion, your hands will come more underneath your shoulders.  Hold this position 30 seconds.  Slowly return to lying flat on the floor. Repeat 2 times. Complete this exercise 3 times per week.   RANGE OF MOTION- Quadruped, Neutral Spine   Assume a hands and knees position on a firm surface. Keep your hands under your shoulders and your knees under your hips. You may place padding under your knees for comfort.  Drop your head and point your tailbone toward the ground below you. This will round out your lower back like an angry cat. Hold this position for 30 seconds.  Slowly lift your head and release your tail bone so that your back sags into a large arch, like an old horse.  Hold this position for 30 seconds.  Repeat this until you feel limber in your low back.  Now, find your "sweet spot." This will be the most comfortable position somewhere between the two previous positions. This is your neutral spine. Once you have found this position, tense your stomach muscles to support your low back.  Hold this position for 30 seconds. Repeat 2 times. Complete this exercise 3 times per week.   STRENGTHENING EXERCISES - Low Back Sprain These exercises may help you when beginning to  rehabilitate your injury. These exercises should be done near your "sweet spot." This is the neutral, low-back arch, somewhere between fully rounded and fully arched, that is your least painful position. When performed in this safe range of motion, these exercises can be used for people who have either a flexion or extension based injury. These exercises may resolve your symptoms with or without further involvement from your physician, physical therapist or athletic trainer. While completing these exercises, remember:   Muscles can gain both the endurance and the strength needed for everyday activities through controlled exercises.  Complete these exercises as instructed by your physician, physical therapist or athletic trainer. Increase the resistance and repetitions only as guided.  You may experience muscle soreness or fatigue, but the pain or discomfort you are trying to eliminate should never  worsen during these exercises. If this pain does worsen, stop and make certain you are following the directions exactly. If the pain is still present after adjustments, discontinue the exercise until you can discuss the trouble with your caregiver.  STRENGTHENING - Deep Abdominals, Pelvic Tilt   Lie on a firm bed or floor. Keeping your legs in front of you, bend your knees so they are both pointed toward the ceiling and your feet are flat on the floor.  Tense your lower abdominal muscles to press your low back into the floor. This motion will rotate your pelvis so that your tail bone is scooping upwards rather than pointing at your feet or into the floor. With a gentle tension and even breathing, hold this position for 3 seconds. Repeat 2 times. Complete this exercise 3 times per week.   STRENGTHENING - Abdominals, Crunches   Lie on a firm bed or floor. Keeping your legs in front of you, bend your knees so they are both pointed toward the ceiling and your feet are flat on the floor. Cross your arms over  your chest.  Slightly tip your chin down without bending your neck.  Tense your abdominals and slowly lift your trunk high enough to just clear your shoulder blades. Lifting higher can put excessive stress on the lower back and does not further strengthen your abdominal muscles.  Control your return to the starting position. Repeat 2 times. Complete this exercise 3 times per week.   STRENGTHENING - Quadruped, Opposite UE/LE Lift   Assume a hands and knees position on a firm surface. Keep your hands under your shoulders and your knees under your hips. You may place padding under your knees for comfort.  Find your neutral spine and gently tense your abdominal muscles so that you can maintain this position. Your shoulders and hips should form a rectangle that is parallel with the floor and is not twisted.  Keeping your trunk steady, lift your right hand no higher than your shoulder and then your left leg no higher than your hip. Make sure you are not holding your breath. Hold this position for 30 seconds.  Continuing to keep your abdominal muscles tense and your back steady, slowly return to your starting position. Repeat with the opposite arm and leg. Repeat 2 times. Complete this exercise 3 times per week.   STRENGTHENING - Abdominals and Quadriceps, Straight Leg Raise   Lie on a firm bed or floor with both legs extended in front of you.  Keeping one leg in contact with the floor, bend the other knee so that your foot can rest flat on the floor.  Find your neutral spine, and tense your abdominal muscles to maintain your spinal position throughout the exercise.  Slowly lift your straight leg off the floor about 6 inches for a count of 3, making sure to not hold your breath.  Still keeping your neutral spine, slowly lower your leg all the way to the floor. Repeat this exercise with each leg 2 times. Complete this exercise 3 times per week.  POSTURE AND BODY MECHANICS CONSIDERATIONS -  Low Back Sprain Keeping correct posture when sitting, standing or completing your activities will reduce the stress put on different body tissues, allowing injured tissues a chance to heal and limiting painful experiences. The following are general guidelines for improved posture.  While reading these guidelines, remember:  The exercises prescribed by your provider will help you have the flexibility and strength to maintain correct postures.  The correct posture provides the best environment for your joints to work. All of your joints have less wear and tear when properly supported by a spine with good posture. This means you will experience a healthier, less painful body.  Correct posture must be practiced with all of your activities, especially prolonged sitting and standing. Correct posture is as important when doing repetitive low-stress activities (typing) as it is when doing a single heavy-load activity (lifting).  RESTING POSITIONS Consider which positions are most painful for you when choosing a resting position. If you have pain with flexion-based activities (sitting, bending, stooping, squatting), choose a position that allows you to rest in a less flexed posture. You would want to avoid curling into a fetal position on your side. If your pain worsens with extension-based activities (prolonged standing, working overhead), avoid resting in an extended position such as sleeping on your stomach. Most people will find more comfort when they rest with their spine in a more neutral position, neither too rounded nor too arched. Lying on a non-sagging bed on your side with a pillow between your knees, or on your back with a pillow under your knees will often provide some relief. Keep in mind, being in any one position for a prolonged period of time, no matter how correct your posture, can still lead to stiffness.  PROPER SITTING POSTURE In order to minimize stress and discomfort on your spine, you  must sit with correct posture. Sitting with good posture should be effortless for a healthy body. Returning to good posture is a gradual process. Many people can work toward this most comfortably by using various supports until they have the flexibility and strength to maintain this posture on their own. When sitting with proper posture, your ears will fall over your shoulders and your shoulders will fall over your hips. You should use the back of the chair to support your upper back. Your lower back will be in a neutral position, just slightly arched. You may place a small pillow or folded towel at the base of your lower back for  support.  When working at a desk, create an environment that supports good, upright posture. Without extra support, muscles tire, which leads to excessive strain on joints and other tissues. Keep these recommendations in mind:  CHAIR:  A chair should be able to slide under your desk when your back makes contact with the back of the chair. This allows you to work closely.  The chair's height should allow your eyes to be level with the upper part of your monitor and your hands to be slightly lower than your elbows.  BODY POSITION  Your feet should make contact with the floor. If this is not possible, use a foot rest.  Keep your ears over your shoulders. This will reduce stress on your neck and low back.  INCORRECT SITTING POSTURES  If you are feeling tired and unable to assume a healthy sitting posture, do not slouch or slump. This puts excessive strain on your back tissues, causing more damage and pain. Healthier options include:  Using more support, like a lumbar pillow.  Switching tasks to something that requires you to be upright or walking.  Talking a brief walk.  Lying down to rest in a neutral-spine position.  PROLONGED STANDING WHILE SLIGHTLY LEANING FORWARD  When completing a task that requires you to lean forward while standing in one place for a long  time, place either foot up on a stationary 2-4  inch high object to help maintain the best posture. When both feet are on the ground, the lower back tends to lose its slight inward curve. If this curve flattens (or becomes too large), then the back and your other joints will experience too much stress, tire more quickly, and can cause pain.  CORRECT STANDING POSTURES Proper standing posture should be assumed with all daily activities, even if they only take a few moments, like when brushing your teeth. As in sitting, your ears should fall over your shoulders and your shoulders should fall over your hips. You should keep a slight tension in your abdominal muscles to brace your spine. Your tailbone should point down to the ground, not behind your body, resulting in an over-extended swayback posture.   INCORRECT STANDING POSTURES  Common incorrect standing postures include a forward head, locked knees and/or an excessive swayback. WALKING Walk with an upright posture. Your ears, shoulders and hips should all line-up.  PROLONGED ACTIVITY IN A FLEXED POSITION When completing a task that requires you to bend forward at your waist or lean over a low surface, try to find a way to stabilize 3 out of 4 of your limbs. You can place a hand or elbow on your thigh or rest a knee on the surface you are reaching across. This will provide you more stability, so that your muscles do not tire as quickly. By keeping your knees relaxed, or slightly bent, you will also reduce stress across your lower back. CORRECT LIFTING TECHNIQUES  DO :  Assume a wide stance. This will provide you more stability and the opportunity to get as close as possible to the object which you are lifting.  Tense your abdominals to brace your spine. Bend at the knees and hips. Keeping your back locked in a neutral-spine position, lift using your leg muscles. Lift with your legs, keeping your back straight.  Test the weight of unknown objects  before attempting to lift them.  Try to keep your elbows locked down at your sides in order get the best strength from your shoulders when carrying an object.     Always ask for help when lifting heavy or awkward objects. INCORRECT LIFTING TECHNIQUES DO NOT:   Lock your knees when lifting, even if it is a small object.  Bend and twist. Pivot at your feet or move your feet when needing to change directions.  Assume that you can safely pick up even a paperclip without proper posture.   Let us know if you need anything.

## 2018-04-13 NOTE — Progress Notes (Signed)
Chief Complaint  Patient presents with  . Hospitalization Follow-up    HPI Dawn Stewart is a 77 y.o. y.o. female who presents for a transition of care visit.  Pt was discharged from [facility].  Within 48 business hours of discharge our office contacted her via telephone to coordinate her care and needs.  She is here with her daughter who helps provide the history.  The patient was admitted for an unknown diagnosis earlier in the month.  Her daughter thinks it might be due to acute kidney injury.  She states that she did not receive any answers.  Since being discharged from the hospital, the patient feels better overall.  She has been without her Janumet but her sugars have been in the 100s.  She has significantly improved her diet.  She is drinking more fluids and doing well.  She has a history of chronic bilateral low back pain.  She used to see a spine specialist, but this was in [facility] and she has not seen anybody in years.  She will have intermittent numbness and tingling in her lower extremity's.  She used to receive injections that were helpful.  Social History   Socioeconomic History  . Marital status: Widowed   Past Medical History:  Diagnosis Date  . Asthma   . COPD (chronic obstructive pulmonary disease) ([place])   . Diabetes mellitus without complication ([place])   . Essential hypertension 05/27/2017  . Hypertension   . Post traumatic stress disorder (PTSD)    Family History  Problem Relation Age of Onset  . Heart disease Mother   . Heart disease Father    Allergies as of 04/13/2018      Reactions   Ivp Dye [iodinated Diagnostic Agents]    unknown   Tape Other (See Comments)   Pt. reports that it gives her blisters.      Medication List        Accurate as of 04/13/18 12:15 PM. Always use your most recent med list.          albuterol 108 (90 Base) MCG/ACT inhaler Commonly known as:  PROVENTIL HFA;VENTOLIN HFA Inhale 2 puffs into the  lungs every 6 (six) hours as needed for wheezing or shortness of breath.   atorvastatin 40 MG tablet Commonly known as:  LIPITOR TAKE 1 TABLET DAILY   buPROPion 150 MG 24 hr tablet Commonly known as:  WELLBUTRIN XL Take 1 tablet (150 mg total) by mouth daily.   carbidopa-levodopa 25-100 MG tablet Commonly known as:  SINEMET IR Take 2 tablets by mouth at bedtime.   clonazePAM 0.5 MG tablet Commonly known as:  KLONOPIN Take 1 tablet (0.5 mg total) by mouth daily as needed for anxiety.   metoprolol tartrate 25 MG tablet Commonly known as:  LOPRESSOR TAKE 1 TABLET TWICE A DAY   metroNIDAZOLE 1 % gel Commonly known as:  METROGEL Apply topically daily.   nitroGLYCERIN 0.4 MG SL tablet Commonly known as:  NITROSTAT Place 0.4 mg under the tongue as needed.   sitaGLIPtin-metformin 50-1000 MG tablet Commonly known as:  JANUMET Take 1 tablet by mouth 2 (two) times daily with a meal.   ticagrelor 90 MG Tabs tablet Commonly known as:  BRILINTA Take 90 mg by mouth daily.   tiotropium 18 MCG inhalation capsule Commonly known as:  SPIRIVA Place 1 capsule (18 mcg total) daily into inhaler and inhale.   valsartan-hydrochlorothiazide 320-12.5 MG tablet Commonly known as:  DIOVAN-HCT TAKE 1 TABLET  DAILY   venlafaxine XR 150 MG 24 hr capsule Commonly known as:  EFFEXOR-XR Take 1 capsule (150 mg total) by mouth daily with breakfast.       ROS:  Constitutional: No fevers or chills, no weight loss HEENT: No headaches, hearing loss, or runny nose, no sore throat Heart: No chest pain Lungs: No SOB, no cough Abd: No bowel changes, no pain, no N/V GU: No urinary complaints Neuro: No current numbness or tingling Msk: +low back  Objective BP 104/72 (BP Location: Right Arm, Patient Position: Sitting, Cuff Size: Normal)   Pulse (!) 55   Temp 97.6 F (36.4 C) (Oral)   Ht 5\' 3"  (1.6 m)   Wt 129 lb (58.5 kg)   SpO2 97%   BMI 22.85 kg/m  General Appearance:  awake, alert,  oriented, in no acute distress and well developed, well nourished Skin:  there are no suspicious lesions or rashes of concern Head/face:  NCAT Eyes:  EOMI, PERRLA Ears:  canals and TMs NI Nose/Sinuses:  negative Mouth/Throat:  Mucosa moist, no lesions; pharynx without erythema, edema or exudate. Neck:  neck- supple, no mass, non-tender and no jvd Lungs: Clear to auscultation.  No rales, rhonchi, or wheezing. Normal effort, no accessory muscle use. Heart:  Heart sounds are normal.  Regular rate and rhythm without murmur, gallop or rub. No bruits. Abdomen:  BS+, soft, NT, ND, no masses or organomegaly Musculoskeletal:  +TTP over lumbar paraspinals and over SI jts b/l Neurologic:  Alert and oriented x 3, gait normal., reflexes normal and symmetric, strength and  sensation grossly normal Psych exam: Nml mood and affect, age appropriate judgment and insight  Acute kidney injury ([place]) - Plan: Basic metabolic panel  Chronic bilateral low back pain without sciatica - Plan: Ambulatory referral to Neurosurgery  Discharge summary and medication list have been reviewed/reconciled.  Labs pending at the time of discharge have been reviewed or are still pending at the time of this visit.  Follow-up labs and appointments have been ordered and/or coordinated appropriately. Educational materials regarding the patient's admitting diagnosis provided.  TRANSITIONAL CARE MANAGEMENT CERTIFICATION:  I certify the following are true:   1. Communication with the patient/care giver was made within 2 business days of discharge.  2. Complexity of Medical decision making is mod.  3. Face to face visit occurred within 14 days of discharge.   Continue Effexor, will add Wellbutrin.  There is likely a situational component with her depression. F/u in 2 mo for med check. The patient and her daughter voiced understanding and agreement to the plan.  [place], DO 04/13/18 12:15 PM

## 2018-04-13 NOTE — Progress Notes (Signed)
Pre visit review using our clinic review tool, if applicable. No additional management support is needed unless otherwise documented below in the visit note. 

## 2018-04-14 ENCOUNTER — Other Ambulatory Visit: Payer: Self-pay

## 2018-04-14 ENCOUNTER — Telehealth: Payer: Self-pay | Admitting: Family Medicine

## 2018-04-14 ENCOUNTER — Other Ambulatory Visit: Payer: Self-pay | Admitting: Family Medicine

## 2018-04-14 ENCOUNTER — Other Ambulatory Visit: Payer: Medicare Other

## 2018-04-14 ENCOUNTER — Other Ambulatory Visit (INDEPENDENT_AMBULATORY_CARE_PROVIDER_SITE_OTHER): Payer: Medicare Other

## 2018-04-14 DIAGNOSIS — E875 Hyperkalemia: Secondary | ICD-10-CM

## 2018-04-14 DIAGNOSIS — N179 Acute kidney failure, unspecified: Secondary | ICD-10-CM

## 2018-04-14 LAB — COMPREHENSIVE METABOLIC PANEL
AG RATIO: 2 (calc) (ref 1.0–2.5)
ALKALINE PHOSPHATASE (APISO): 122 U/L (ref 33–130)
ALT: 14 U/L (ref 6–29)
AST: 26 U/L (ref 10–35)
Albumin: 4.5 g/dL (ref 3.6–5.1)
BUN/Creatinine Ratio: 23 (calc) — ABNORMAL HIGH (ref 6–22)
BUN: 42 mg/dL — ABNORMAL HIGH (ref 7–25)
CHLORIDE: 99 mmol/L (ref 98–110)
CO2: 23 mmol/L (ref 20–32)
Calcium: 10.5 mg/dL — ABNORMAL HIGH (ref 8.6–10.4)
Creat: 1.85 mg/dL — ABNORMAL HIGH (ref 0.60–0.93)
GLOBULIN: 2.3 g/dL (ref 1.9–3.7)
Glucose, Bld: 112 mg/dL — ABNORMAL HIGH (ref 65–99)
Potassium: 4.8 mmol/L (ref 3.5–5.3)
Sodium: 137 mmol/L (ref 135–146)
Total Bilirubin: 0.4 mg/dL (ref 0.2–1.2)
Total Protein: 6.8 g/dL (ref 6.1–8.1)

## 2018-04-14 NOTE — Telephone Encounter (Signed)
Copied from [place] [id number]. Topic: General - Other >> Apr 14, 2018 10:00 AM Margot Ables wrote: Reason for CRM: following up on order request faxed to [phone] 5/6 or 5/7 requesting [facility] SN orders for 9 weeks, 2x week 2 weeks & 1x week for 7 weeks, starting Monday 04/10/18. If you haven't received she can take a VO. She has confidential VM if she is not able to answer.

## 2018-04-14 NOTE — Telephone Encounter (Signed)
Called HHRN informed of verbal ok per PCP. 

## 2018-04-14 NOTE — Addendum Note (Signed)
Addended by: Caffie Pinto on: 04/14/2018 03:38 PM   Modules accepted: Orders

## 2018-04-15 ENCOUNTER — Other Ambulatory Visit: Payer: Self-pay | Admitting: Family Medicine

## 2018-04-15 DIAGNOSIS — R7989 Other specified abnormal findings of blood chemistry: Secondary | ICD-10-CM

## 2018-04-18 ENCOUNTER — Telehealth: Payer: Self-pay | Admitting: Family Medicine

## 2018-04-18 NOTE — Telephone Encounter (Signed)
Copied from [place] [id number]. Topic: Quick Communication - See Telephone Encounter >> Apr 18, 2018  2:57 PM Synthia Innocent wrote: CRM for notification. See Telephone encounter for: 04/18/18. Patient declined visit with [facility] today

## 2018-04-19 ENCOUNTER — Other Ambulatory Visit (INDEPENDENT_AMBULATORY_CARE_PROVIDER_SITE_OTHER): Payer: Medicare Other

## 2018-04-19 ENCOUNTER — Inpatient Hospital Stay: Payer: Medicare Other | Admitting: Family Medicine

## 2018-04-19 DIAGNOSIS — R7989 Other specified abnormal findings of blood chemistry: Secondary | ICD-10-CM | POA: Diagnosis not present

## 2018-04-19 LAB — COMPREHENSIVE METABOLIC PANEL
ALBUMIN: 4.5 g/dL (ref 3.5–5.2)
ALT: 13 U/L (ref 0–35)
AST: 19 U/L (ref 0–37)
Alkaline Phosphatase: 112 U/L (ref 39–117)
BILIRUBIN TOTAL: 0.4 mg/dL (ref 0.2–1.2)
BUN: 31 mg/dL — AB (ref 6–23)
CALCIUM: 9.5 mg/dL (ref 8.4–10.5)
CHLORIDE: 97 meq/L (ref 96–112)
CO2: 27 mEq/L (ref 19–32)
CREATININE: 1.48 mg/dL — AB (ref 0.40–1.20)
GFR: 36.5 mL/min — ABNORMAL LOW (ref >60.00)
Glucose, Bld: 151 mg/dL — ABNORMAL HIGH (ref 70–99)
Potassium: 4.2 mEq/L (ref 3.5–5.1)
SODIUM: 136 meq/L (ref 135–145)
TOTAL PROTEIN: 7.1 g/dL (ref 6.0–8.3)

## 2018-04-21 ENCOUNTER — Telehealth: Payer: Self-pay | Admitting: *Deleted

## 2018-04-21 DIAGNOSIS — N183 Chronic kidney disease, stage 3 (moderate): Secondary | ICD-10-CM | POA: Diagnosis not present

## 2018-04-21 DIAGNOSIS — E1122 Type 2 diabetes mellitus with diabetic chronic kidney disease: Secondary | ICD-10-CM | POA: Diagnosis not present

## 2018-04-21 DIAGNOSIS — I2510 Atherosclerotic heart disease of native coronary artery without angina pectoris: Secondary | ICD-10-CM | POA: Diagnosis not present

## 2018-04-21 DIAGNOSIS — E1165 Type 2 diabetes mellitus with hyperglycemia: Secondary | ICD-10-CM | POA: Diagnosis not present

## 2018-04-21 DIAGNOSIS — I129 Hypertensive chronic kidney disease with stage 1 through stage 4 chronic kidney disease, or unspecified chronic kidney disease: Secondary | ICD-10-CM | POA: Diagnosis not present

## 2018-04-21 DIAGNOSIS — J439 Emphysema, unspecified: Secondary | ICD-10-CM | POA: Diagnosis not present

## 2018-04-21 NOTE — Telephone Encounter (Signed)
Received Home Health Certification and Plan of Care; forwarded to provider/SLS 05/17

## 2018-04-27 ENCOUNTER — Telehealth: Payer: Self-pay | Admitting: Pulmonary Disease

## 2018-04-27 DIAGNOSIS — E1165 Type 2 diabetes mellitus with hyperglycemia: Secondary | ICD-10-CM | POA: Diagnosis not present

## 2018-04-27 DIAGNOSIS — J439 Emphysema, unspecified: Secondary | ICD-10-CM | POA: Diagnosis not present

## 2018-04-27 DIAGNOSIS — E1122 Type 2 diabetes mellitus with diabetic chronic kidney disease: Secondary | ICD-10-CM | POA: Diagnosis not present

## 2018-04-27 DIAGNOSIS — I2510 Atherosclerotic heart disease of native coronary artery without angina pectoris: Secondary | ICD-10-CM | POA: Diagnosis not present

## 2018-04-27 DIAGNOSIS — N183 Chronic kidney disease, stage 3 (moderate): Secondary | ICD-10-CM | POA: Diagnosis not present

## 2018-04-27 DIAGNOSIS — I129 Hypertensive chronic kidney disease with stage 1 through stage 4 chronic kidney disease, or unspecified chronic kidney disease: Secondary | ICD-10-CM | POA: Diagnosis not present

## 2018-04-27 NOTE — Telephone Encounter (Signed)
Called Patient Daughter Renna.  She stated that her Mother was unsure which MD office called.  Nothing in chart about call, no results, and no recall for appt found in chart. Nothing further needed at this time.

## 2018-05-01 ENCOUNTER — Other Ambulatory Visit: Payer: Self-pay | Admitting: Family Medicine

## 2018-05-03 DIAGNOSIS — E1122 Type 2 diabetes mellitus with diabetic chronic kidney disease: Secondary | ICD-10-CM | POA: Diagnosis not present

## 2018-05-03 DIAGNOSIS — J439 Emphysema, unspecified: Secondary | ICD-10-CM | POA: Diagnosis not present

## 2018-05-03 DIAGNOSIS — N183 Chronic kidney disease, stage 3 (moderate): Secondary | ICD-10-CM | POA: Diagnosis not present

## 2018-05-03 DIAGNOSIS — E1165 Type 2 diabetes mellitus with hyperglycemia: Secondary | ICD-10-CM | POA: Diagnosis not present

## 2018-05-03 DIAGNOSIS — I129 Hypertensive chronic kidney disease with stage 1 through stage 4 chronic kidney disease, or unspecified chronic kidney disease: Secondary | ICD-10-CM | POA: Diagnosis not present

## 2018-05-03 DIAGNOSIS — I2510 Atherosclerotic heart disease of native coronary artery without angina pectoris: Secondary | ICD-10-CM | POA: Diagnosis not present

## 2018-05-08 ENCOUNTER — Other Ambulatory Visit: Payer: Self-pay | Admitting: Family Medicine

## 2018-05-10 DIAGNOSIS — M545 Low back pain: Secondary | ICD-10-CM | POA: Diagnosis not present

## 2018-05-10 DIAGNOSIS — I10 Essential (primary) hypertension: Secondary | ICD-10-CM | POA: Diagnosis not present

## 2018-05-10 DIAGNOSIS — I739 Peripheral vascular disease, unspecified: Secondary | ICD-10-CM | POA: Diagnosis not present

## 2018-05-10 DIAGNOSIS — G8929 Other chronic pain: Secondary | ICD-10-CM | POA: Diagnosis not present

## 2018-05-17 ENCOUNTER — Ambulatory Visit (HOSPITAL_COMMUNITY): Payer: Medicare Other | Admitting: Psychiatry

## 2018-05-17 ENCOUNTER — Encounter

## 2018-05-30 ENCOUNTER — Telehealth: Payer: Self-pay | Admitting: Family Medicine

## 2018-05-30 DIAGNOSIS — F4310 Post-traumatic stress disorder, unspecified: Secondary | ICD-10-CM

## 2018-05-30 NOTE — Telephone Encounter (Signed)
Copied from [place] [id number]. Topic: Quick Communication - Rx Refill/Question >> May 30, 2018  1:13 PM Boyd Kerbs wrote: Medication:   Venlafaxine XR (EFFEXOR-XR) 150 MG 24 hr capsule gabapentin (NEURONTIN) 800 MG tablet buPROPion (WELLBUTRIN XL) 150 MG 24 hr tablet   2 prescriptions went to [facility] on 5/9 for 90 days - Wellbutrin and Gabapentin - She did not receive - per [facility] they will have to be rejected and will have to override to get prescriptions here at [facility]   This was sent to [facility] [address] [place] [address] [phone] She is saying if she got it - she took it over to [facility] and they may have lost it.  But not sure    Has the patient contacted their pharmacy? No. (Agent: If no, request that the patient contact the pharmacy for the refill.) (Agent: If yes, when and what did the pharmacy advise?)  Preferred Pharmacy (with phone number or street name):   [facility] [phone] - [place], [place] - [address] AT [place] [address] [place] [address] Phone: [phone] Fax: [phone]    Agent: Please be advised that RX refills may take up to 3 business days. We ask that you follow-up with your pharmacy.

## 2018-05-31 MED ORDER — BUPROPION HCL ER (XL) 150 MG PO TB24: 150.0000 mg | ORAL_TABLET | Freq: Every day | ORAL | 0 refills | Status: DC

## 2018-05-31 MED ORDER — VENLAFAXINE HCL ER 150 MG PO CP24: 150.0000 mg | ORAL_CAPSULE | Freq: Every day | ORAL | 0 refills | Status: AC

## 2018-05-31 MED ORDER — VENLAFAXINE HCL ER 150 MG PO CP24: 150.0000 mg | ORAL_CAPSULE | Freq: Every day | ORAL | 0 refills | Status: DC

## 2018-05-31 MED ORDER — GABAPENTIN 800 MG PO TABS: 800.0000 mg | ORAL_TABLET | Freq: Every day | ORAL | 0 refills | Status: DC

## 2018-05-31 NOTE — Telephone Encounter (Signed)
Was unable to get the daughter on the phone. [facility] in [facility] to [facility]---Did speak to the patient and she requested send into both.

## 2018-06-06 DIAGNOSIS — M545 Low back pain: Secondary | ICD-10-CM | POA: Diagnosis not present

## 2018-06-13 ENCOUNTER — Other Ambulatory Visit: Payer: Self-pay | Admitting: Family Medicine

## 2018-06-13 DIAGNOSIS — G2581 Restless legs syndrome: Secondary | ICD-10-CM

## 2018-06-14 DIAGNOSIS — I739 Peripheral vascular disease, unspecified: Secondary | ICD-10-CM | POA: Diagnosis not present

## 2018-06-15 ENCOUNTER — Ambulatory Visit (INDEPENDENT_AMBULATORY_CARE_PROVIDER_SITE_OTHER): Payer: Medicare Other | Admitting: Family Medicine

## 2018-06-15 ENCOUNTER — Encounter: Payer: Self-pay | Admitting: Family Medicine

## 2018-06-15 VITALS — BP 140/72 | HR 66 | Temp 98.1°F | Ht 63.0 in | Wt 124.8 lb

## 2018-06-15 DIAGNOSIS — F4310 Post-traumatic stress disorder, unspecified: Secondary | ICD-10-CM | POA: Diagnosis not present

## 2018-06-15 MED ORDER — VALSARTAN-HYDROCHLOROTHIAZIDE 320-12.5 MG PO TABS: 1.0000 | ORAL_TABLET | Freq: Every day | ORAL | 1 refills | Status: DC

## 2018-06-15 MED ORDER — ATORVASTATIN CALCIUM 40 MG PO TABS: 40.0000 mg | ORAL_TABLET | Freq: Every day | ORAL | 1 refills | Status: AC

## 2018-06-15 NOTE — Progress Notes (Signed)
Chief Complaint  Patient presents with  . Follow-up    Pt here for 2 month f/u on VENLAFAXINE xr (EFFEXOR-XR) 150 MG. Pt states that she's tolerating medication well with no side effects.     Subjective: Patient is a 77 y.o. female here for med f/u. Here with sister.  Currently on Effexor for PTSD. Wellbutrin was added as she is losing her sight and has assoc depression with it.  Since that time, she has felt much better. She was not tolerating AE's of Wellbutrin and thus stopped. Feels better without the abd pain she got from it.  Feels good now, much more accepting of her dx.  Does have LBP, saw Neurosurg who thinks this could be claudication related. She has an appt for both imaging and to see a provider with the [place] vascular surgery team next week. She is not aware of the specifics and I cannot see the times. Number provided for pt or her sister to call for specifics.    ROS: Psych: As noted in HPI  Past Medical History:  Diagnosis Date  . Asthma   . COPD (chronic obstructive pulmonary disease) ([place])   . Diabetes mellitus without complication ([place])   . Essential hypertension 05/27/2017  . Hypertension   . Post traumatic stress disorder (PTSD)     Objective: BP 140/72 (BP Location: Right Arm, Patient Position: Sitting, Cuff Size: Normal)   Pulse 66   Temp 98.1 F (36.7 C) (Oral)   Ht 5\' 3"  (1.6 m)   Wt 124 lb 12.8 oz (56.6 kg)   SpO2 95%   BMI 22.11 kg/m  General: Awake, appears stated age HEENT: MMM, EOMi  Heart: RRR, +b/l carotid bruits Lungs: CTAB, no rales, wheezes or rhonchi. No accessory muscle use Psych: Normal affect and mood  Assessment and Plan: PTSD (post-traumatic stress disorder)  Orders as above. Cont Effexor.  F/u in 2 mo for DM visit.  The patient voiced understanding and agreement to the plan.  [place], DO 06/15/18  1:14 PM

## 2018-06-15 NOTE — Patient Instructions (Addendum)
   Phone: [phone]   As about imaging and the appointment on 7-16 and 7-17 respectively.  Come to your next appointment fasting.  Let us know if you need anything.

## 2018-06-20 DIAGNOSIS — I6523 Occlusion and stenosis of bilateral carotid arteries: Secondary | ICD-10-CM | POA: Diagnosis not present

## 2018-06-20 DIAGNOSIS — K551 Chronic vascular disorders of intestine: Secondary | ICD-10-CM | POA: Diagnosis not present

## 2018-06-20 DIAGNOSIS — I70213 Atherosclerosis of native arteries of extremities with intermittent claudication, bilateral legs: Secondary | ICD-10-CM | POA: Diagnosis not present

## 2018-06-21 DIAGNOSIS — I739 Peripheral vascular disease, unspecified: Secondary | ICD-10-CM | POA: Diagnosis not present

## 2018-06-21 DIAGNOSIS — I70213 Atherosclerosis of native arteries of extremities with intermittent claudication, bilateral legs: Secondary | ICD-10-CM | POA: Diagnosis not present

## 2018-06-21 DIAGNOSIS — K551 Chronic vascular disorders of intestine: Secondary | ICD-10-CM | POA: Diagnosis not present

## 2018-06-21 DIAGNOSIS — I6523 Occlusion and stenosis of bilateral carotid arteries: Secondary | ICD-10-CM | POA: Diagnosis not present

## 2018-07-07 DIAGNOSIS — K551 Chronic vascular disorders of intestine: Secondary | ICD-10-CM | POA: Diagnosis not present

## 2018-07-07 DIAGNOSIS — I6523 Occlusion and stenosis of bilateral carotid arteries: Secondary | ICD-10-CM | POA: Diagnosis not present

## 2018-07-07 DIAGNOSIS — I70213 Atherosclerosis of native arteries of extremities with intermittent claudication, bilateral legs: Secondary | ICD-10-CM | POA: Diagnosis not present

## 2018-07-13 ENCOUNTER — Ambulatory Visit (INDEPENDENT_AMBULATORY_CARE_PROVIDER_SITE_OTHER): Payer: Medicare Other | Admitting: Family Medicine

## 2018-07-13 ENCOUNTER — Encounter: Payer: Self-pay | Admitting: Family Medicine

## 2018-07-13 VITALS — BP 118/76 | HR 99 | Temp 98.1°F | Ht 63.0 in | Wt 122.4 lb

## 2018-07-13 DIAGNOSIS — R7989 Other specified abnormal findings of blood chemistry: Secondary | ICD-10-CM

## 2018-07-13 DIAGNOSIS — R5383 Other fatigue: Secondary | ICD-10-CM | POA: Diagnosis not present

## 2018-07-13 DIAGNOSIS — E119 Type 2 diabetes mellitus without complications: Secondary | ICD-10-CM | POA: Diagnosis not present

## 2018-07-13 MED ORDER — GLUCOSE BLOOD VI STRP: ORAL_STRIP | 2 refills | Status: AC

## 2018-07-13 NOTE — Progress Notes (Signed)
Pre visit review using our clinic review tool, if applicable. No additional management support is needed unless otherwise documented below in the visit note. 

## 2018-07-13 NOTE — Patient Instructions (Signed)
Give [place] 2-3 business days to get the results of your labs back.   Stay hydrated.  We may change your medicines depending on the results of your labs.   Let us know if you need anything.

## 2018-07-13 NOTE — Progress Notes (Signed)
Chief Complaint  Patient presents with  . Fatigue    Subjective: Patient is a 77 y.o. female here for fatigue. Here w daughter who helps provide hx.   Has happened over the past 2 mo. Eating is OK, does not want to prepare food. Feels low energy and not wanting to get out of bed. Mood is well controlled on Wellbutrin and Effexor.   Pt has not been compliant with Janumet. Takes prn. Was told by sister it could cause her hypoglycemia so she doesn't take it if sugar is low.    ROS: Psych: Denies depression Endo: +fatigue  Past Medical History:  Diagnosis Date  . Asthma   . COPD (chronic obstructive pulmonary disease) ([place])   . Diabetes mellitus without complication ([place])   . Essential hypertension 05/27/2017  . Hypertension   . Post traumatic stress disorder (PTSD)     Objective: BP 118/76 (BP Location: Left Arm, Patient Position: Sitting, Cuff Size: Normal)   Pulse 99   Temp 98.1 F (36.7 C) (Oral)   Ht 5\' 3"  (1.6 m)   Wt 122 lb 6 oz (55.5 kg)   BMI 21.68 kg/m  General: Awake, appears stated age HEENT: MMM, EOMi Heart: RRR, no murmurs Lungs: CTAB, no rales, wheezes or rhonchi. No accessory muscle use Psych: Normal affect and mood  Assessment and Plan: Elevated serum creatinine - Plan: Basic metabolic panel, Microalbumin / creatinine urine ratio  Fatigue, unspecified type - Plan: CBC, TSH  Diabetes mellitus type 2 in nonobese (HCC) - Plan: Hemoglobin A1c  Orders as above. Need to call Renna for results. May need to take off HCTZ from Diovan combo pill. F/u pending above.  The patient voiced understanding and agreement to the plan.  [place], DO 07/13/18  4:24 PM

## 2018-07-14 ENCOUNTER — Other Ambulatory Visit: Payer: Self-pay | Admitting: Family Medicine

## 2018-07-14 LAB — MICROALBUMIN / CREATININE URINE RATIO
CREATININE, U: 149.8 mg/dL
MICROALB/CREAT RATIO: 1.2 mg/g (ref 0.0–30.0)
Microalb, Ur: 1.7 mg/dL (ref 0.0–1.9)

## 2018-07-14 LAB — BASIC METABOLIC PANEL
BUN: 48 mg/dL — ABNORMAL HIGH (ref 6–23)
CO2: 25 mEq/L (ref 19–32)
Calcium: 10 mg/dL (ref 8.4–10.5)
Chloride: 101 mEq/L (ref 96–112)
Creatinine, Ser: 1.96 mg/dL — ABNORMAL HIGH (ref 0.40–1.20)
GFR: 26.38 mL/min — AB (ref >60.00)
Glucose, Bld: 188 mg/dL — ABNORMAL HIGH (ref 70–99)
Potassium: 5.1 mEq/L (ref 3.5–5.1)
SODIUM: 139 meq/L (ref 135–145)

## 2018-07-14 LAB — CBC
HCT: 39.7 % (ref 36.0–46.0)
HEMOGLOBIN: 13.1 g/dL (ref 12.0–15.0)
MCHC: 32.9 g/dL (ref 30.0–36.0)
MCV: 85.3 fl (ref 78.0–100.0)
PLATELETS: 248 10*3/uL (ref 150.0–400.0)
RBC: 4.66 Mil/uL (ref 3.87–5.11)
RDW: 14.6 % (ref 11.5–15.5)
WBC: 10.4 10*3/uL (ref 4.0–10.5)

## 2018-07-14 LAB — HEMOGLOBIN A1C: Hgb A1c MFr Bld: 7 % — ABNORMAL HIGH (ref 4.6–6.5)

## 2018-07-14 LAB — TSH: TSH: 1.38 u[IU]/mL (ref 0.35–4.50)

## 2018-07-14 MED ORDER — VALSARTAN 320 MG PO TABS: 320.0000 mg | ORAL_TABLET | Freq: Every day | ORAL | 2 refills | Status: AC

## 2018-07-17 ENCOUNTER — Other Ambulatory Visit: Payer: Self-pay | Admitting: Family Medicine

## 2018-07-17 ENCOUNTER — Telehealth: Payer: Self-pay | Admitting: Family Medicine

## 2018-07-17 DIAGNOSIS — R7309 Other abnormal glucose: Secondary | ICD-10-CM

## 2018-07-17 NOTE — Telephone Encounter (Signed)
Pt's daughter, Althea Grimmer, given results per Dr Nani Ravens, "Let pt know her sugars are well controlled for her. No changes with diabetes medication. Her kidney function is struggling though. I would like to change her blood pressure medication and recheck BMP in 2 weeks. We will stop the Diovan and start valsartan by itself. I have called this in to [facility]. Please schedule and order BMP"; she verbalizes understanding and schedules a lab appointment on 08/01/18 at 1315 for repeat lab; she also verbalizes understanding that the prescription for new prescription vorvalsartan 320mg  was sent to [facility] [address] [employer].

## 2018-07-18 ENCOUNTER — Telehealth: Payer: Self-pay

## 2018-07-18 NOTE — Telephone Encounter (Signed)
PA approved until 12-05-2098 

## 2018-07-18 NOTE — Telephone Encounter (Signed)
Received PA forms from [facility] for True Metrix blood glucose strips and quantity exception form. Forms completed and faxed to [phone]. Awaiting determination.

## 2018-07-26 ENCOUNTER — Other Ambulatory Visit: Payer: Self-pay | Admitting: Family Medicine

## 2018-07-26 DIAGNOSIS — F4310 Post-traumatic stress disorder, unspecified: Secondary | ICD-10-CM

## 2018-07-26 MED ORDER — CLONAZEPAM 0.5 MG PO TABS: 0.5000 mg | ORAL_TABLET | Freq: Every day | ORAL | 1 refills | Status: AC | PRN

## 2018-07-26 NOTE — Telephone Encounter (Signed)
Refill of Klonopin  LOV 07/13/18 Dr. Nani Ravens  [facility] 03/09/18  #90 0 refills  [facility] - [place], [place] - [address]            [phone] (Phone) [phone] (Fax)

## 2018-07-26 NOTE — Telephone Encounter (Signed)
Copied from [place] [phone]. Topic: Quick Communication - See Telephone Encounter >> Jul 26, 2018 11:29 AM Mylinda Latina, NT wrote: CRM for notification. See Telephone encounter for: 07/26/18. Patient called and states she needs a refill of her clonazePAM (KLONOPIN) 0.5 MG tablet  [facility] - [place], [place] - [address] [phone] (Phone) [phone] (Fax)

## 2018-08-01 ENCOUNTER — Other Ambulatory Visit (INDEPENDENT_AMBULATORY_CARE_PROVIDER_SITE_OTHER): Payer: Medicare Other

## 2018-08-01 DIAGNOSIS — R7309 Other abnormal glucose: Secondary | ICD-10-CM

## 2018-08-01 LAB — BASIC METABOLIC PANEL
BUN: 23 mg/dL (ref 6–23)
CO2: 23 meq/L (ref 19–32)
Calcium: 9.8 mg/dL (ref 8.4–10.5)
Chloride: 104 mEq/L (ref 96–112)
Creatinine, Ser: 1.35 mg/dL — ABNORMAL HIGH (ref 0.40–1.20)
GFR: 40.56 mL/min — AB (ref >60.00)
Glucose, Bld: 198 mg/dL — ABNORMAL HIGH (ref 70–99)
Potassium: 4.6 mEq/L (ref 3.5–5.1)
SODIUM: 138 meq/L (ref 135–145)

## 2018-08-02 ENCOUNTER — Telehealth: Payer: Self-pay

## 2018-08-02 NOTE — Telephone Encounter (Signed)
Copied from [place] [id number]. Topic: General - Other >> Aug 02, 2018  3:59 PM Judyann Munson wrote: Reason for CRM: Patient sister  is requesting a call in regards to having a test done. She not sure what the test called. Please advise

## 2018-08-02 NOTE — Telephone Encounter (Signed)
Returned call to patients sister. Left message to return call with clarification on what test  she is requesting for patient.

## 2018-08-04 ENCOUNTER — Telehealth: Payer: Self-pay | Admitting: Family Medicine

## 2018-08-04 NOTE — Telephone Encounter (Signed)
Spoke to the daughter [name]) last week to call her vascular surgeon

## 2018-08-04 NOTE — Telephone Encounter (Unsigned)
Copied from [place] [id number]. Topic: General - Other >> Aug 02, 2018  3:59 PM Judyann Munson wrote: Reason for CRM: Patient sister  is requesting a call in regards to having a test done. She not sure what the test called. Please advise >> Aug 04, 2018  9:48 AM Neva Seat wrote: Pt called back to speak to Santiago Glad.  Please have Santiago Glad to give her a call back asap.

## 2018-08-10 DIAGNOSIS — N281 Cyst of kidney, acquired: Secondary | ICD-10-CM | POA: Diagnosis not present

## 2018-08-10 DIAGNOSIS — Z7984 Long term (current) use of oral hypoglycemic drugs: Secondary | ICD-10-CM | POA: Diagnosis not present

## 2018-08-10 DIAGNOSIS — J449 Chronic obstructive pulmonary disease, unspecified: Secondary | ICD-10-CM | POA: Diagnosis not present

## 2018-08-10 DIAGNOSIS — F17290 Nicotine dependence, other tobacco product, uncomplicated: Secondary | ICD-10-CM | POA: Diagnosis not present

## 2018-08-10 DIAGNOSIS — I129 Hypertensive chronic kidney disease with stage 1 through stage 4 chronic kidney disease, or unspecified chronic kidney disease: Secondary | ICD-10-CM | POA: Diagnosis not present

## 2018-08-10 DIAGNOSIS — N183 Chronic kidney disease, stage 3 (moderate): Secondary | ICD-10-CM | POA: Diagnosis not present

## 2018-08-10 DIAGNOSIS — E1122 Type 2 diabetes mellitus with diabetic chronic kidney disease: Secondary | ICD-10-CM | POA: Diagnosis not present

## 2018-08-10 DIAGNOSIS — G2581 Restless legs syndrome: Secondary | ICD-10-CM | POA: Diagnosis not present

## 2018-08-10 DIAGNOSIS — I70213 Atherosclerosis of native arteries of extremities with intermittent claudication, bilateral legs: Secondary | ICD-10-CM | POA: Diagnosis not present

## 2018-08-16 DIAGNOSIS — N183 Chronic kidney disease, stage 3 (moderate): Secondary | ICD-10-CM | POA: Diagnosis not present

## 2018-08-16 DIAGNOSIS — N281 Cyst of kidney, acquired: Secondary | ICD-10-CM | POA: Diagnosis not present

## 2018-09-04 ENCOUNTER — Ambulatory Visit (INDEPENDENT_AMBULATORY_CARE_PROVIDER_SITE_OTHER): Payer: Medicare Other | Admitting: Family Medicine

## 2018-09-04 ENCOUNTER — Encounter: Payer: Self-pay | Admitting: Family Medicine

## 2018-09-04 VITALS — BP 118/76 | HR 72 | Temp 98.2°F | Ht 63.0 in | Wt 118.4 lb

## 2018-09-04 DIAGNOSIS — N179 Acute kidney failure, unspecified: Secondary | ICD-10-CM

## 2018-09-04 DIAGNOSIS — Z23 Encounter for immunization: Secondary | ICD-10-CM | POA: Diagnosis not present

## 2018-09-04 DIAGNOSIS — J4520 Mild intermittent asthma, uncomplicated: Secondary | ICD-10-CM

## 2018-09-04 DIAGNOSIS — E119 Type 2 diabetes mellitus without complications: Secondary | ICD-10-CM | POA: Diagnosis not present

## 2018-09-04 LAB — BASIC METABOLIC PANEL
BUN: 28 mg/dL — ABNORMAL HIGH (ref 6–23)
CHLORIDE: 100 meq/L (ref 96–112)
CO2: 25 meq/L (ref 19–32)
Calcium: 9.4 mg/dL (ref 8.4–10.5)
Creatinine, Ser: 1.51 mg/dL — ABNORMAL HIGH (ref 0.40–1.20)
GFR: 35.63 mL/min — ABNORMAL LOW (ref >60.00)
Glucose, Bld: 149 mg/dL — ABNORMAL HIGH (ref 70–99)
POTASSIUM: 4.6 meq/L (ref 3.5–5.1)
SODIUM: 137 meq/L (ref 135–145)

## 2018-09-04 MED ORDER — ALBUTEROL SULFATE HFA 108 (90 BASE) MCG/ACT IN AERS: 2.0000 | INHALATION_SPRAY | Freq: Four times a day (QID) | RESPIRATORY_TRACT | 0 refills | Status: AC | PRN

## 2018-09-04 MED ORDER — TIOTROPIUM BROMIDE MONOHYDRATE 18 MCG IN CAPS: 18.0000 ug | ORAL_CAPSULE | Freq: Every day | RESPIRATORY_TRACT | 3 refills | Status: AC

## 2018-09-04 NOTE — Addendum Note (Signed)
Addended by: Sharon Seller B on: 09/04/2018 01:33 PM   Modules accepted: Orders

## 2018-09-04 NOTE — Progress Notes (Signed)
Pre visit review using our clinic review tool, if applicable. No additional management support is needed unless otherwise documented below in the visit note. 

## 2018-09-04 NOTE — Progress Notes (Signed)
Subjective:   Chief Complaint  Patient presents with  . Follow-up    Dawn Stewart is a 77 y.o. female here for follow-up of diabetes.   Dawn Stewart does not routinely check her sugars. Patient does not require insulin.   Medications include: Janumet 50-1000 mg bid Reports diet is healthy overall. Patient exercises 0 days per week on average.    Had episode of elevated Cr in early Aug, meds were changed and it resolved. Making sure renal function in good standing today.  Past Medical History:  Diagnosis Date  . Asthma   . COPD (chronic obstructive pulmonary disease) ([place])   . Diabetes mellitus without complication ([place])   . Essential hypertension 05/27/2017  . Hypertension   . Post traumatic stress disorder (PTSD)     Related testing: Pneumovax: doing PCV13 today Flu Shot: done  Review of Systems: Pulmonary:  No SOB Cardiovascular:  No chest pain  Objective:  BP 118/76 (BP Location: Left Arm, Patient Position: Sitting, Cuff Size: Normal)   Pulse 72   Temp 98.2 F (36.8 C) (Oral)   Ht _0  (1.6 m)   Wt 118 lb 6 oz (53.7 kg)   SpO2 97%   BMI 20.97 kg/m  General:  Well developed, well nourished, in no apparent distress Skin:  Warm, no pallor or diaphoresis Head:  Normocephalic, atraumatic Eyes:  Pupils equal and round, sclera anicteric without injection  Lungs:  CTAB, no access msc use Cardio:  RRR, +b/l carotid bruits, no LE edema  Musculoskeletal:  Symmetrical muscle groups noted without atrophy or deformity Neuro:  Sensation intact to pinprick on feet Psych: Age appropriate judgment and insight  Assessment:   Diabetes mellitus type 2 in nonobese ([place]) - Plan: HM DIABETES FOOT EXAM  Acute kidney injury ([place]) - Plan: Basic metabolic panel   Plan:   Orders as above. Counseled on diet and exercise. Tetanus and Prevnar today. PCV23 in 1 year. A1c in good standing. Will hold off on requiring DM eye exams as mac degen is taking her vision. F/u in 6 mo. The  patient voiced understanding and agreement to the plan.  [place], DO 09/04/18 1:03 PM

## 2018-09-04 NOTE — Patient Instructions (Addendum)
Once you here from [place] and get the OK regarding your kidney function, it will be OK to reschedule your testing.   Give [place] 2-3 business days to get the results of your labs back.   You don't need to check your sugars at home unless you want to.  Let us know if you need anything.

## 2018-09-05 ENCOUNTER — Other Ambulatory Visit: Payer: Self-pay | Admitting: Family Medicine

## 2018-09-13 DIAGNOSIS — I739 Peripheral vascular disease, unspecified: Secondary | ICD-10-CM | POA: Diagnosis not present

## 2018-09-13 DIAGNOSIS — E1122 Type 2 diabetes mellitus with diabetic chronic kidney disease: Secondary | ICD-10-CM | POA: Diagnosis not present

## 2018-09-13 DIAGNOSIS — N183 Chronic kidney disease, stage 3 (moderate): Secondary | ICD-10-CM | POA: Diagnosis not present

## 2018-09-20 DIAGNOSIS — I739 Peripheral vascular disease, unspecified: Secondary | ICD-10-CM | POA: Diagnosis not present

## 2018-09-20 DIAGNOSIS — N183 Chronic kidney disease, stage 3 (moderate): Secondary | ICD-10-CM | POA: Diagnosis not present

## 2018-09-20 DIAGNOSIS — I70213 Atherosclerosis of native arteries of extremities with intermittent claudication, bilateral legs: Secondary | ICD-10-CM | POA: Diagnosis not present

## 2018-09-22 DIAGNOSIS — R160 Hepatomegaly, not elsewhere classified: Secondary | ICD-10-CM | POA: Diagnosis not present

## 2018-09-22 DIAGNOSIS — I70213 Atherosclerosis of native arteries of extremities with intermittent claudication, bilateral legs: Secondary | ICD-10-CM | POA: Diagnosis not present

## 2018-09-22 DIAGNOSIS — E1151 Type 2 diabetes mellitus with diabetic peripheral angiopathy without gangrene: Secondary | ICD-10-CM | POA: Diagnosis not present

## 2018-09-27 ENCOUNTER — Telehealth: Payer: Self-pay | Admitting: Family Medicine

## 2018-09-27 NOTE — Telephone Encounter (Signed)
Copied from [place] [phone]. Topic: General - Other >> Sep 26, 2018 12:59 PM Yvette Rack wrote: Reason for CRM: pt calling stating that the gabapentin (NEURONTIN) 800 MG tablet isn't strong enough due to mass on her  back and maybe liver she states that her back is hurting all the time  [id number] - [place], [place] - [address] AT [place] [phone] (Phone) [phone] (Fax)

## 2018-09-27 NOTE — Telephone Encounter (Signed)
She can f/u with her spine provider or f/u here. TY.

## 2018-09-28 DIAGNOSIS — R160 Hepatomegaly, not elsewhere classified: Secondary | ICD-10-CM | POA: Diagnosis not present

## 2018-09-28 DIAGNOSIS — R112 Nausea with vomiting, unspecified: Secondary | ICD-10-CM | POA: Diagnosis not present

## 2018-09-28 DIAGNOSIS — I771 Stricture of artery: Secondary | ICD-10-CM | POA: Diagnosis not present

## 2018-09-28 DIAGNOSIS — I774 Celiac artery compression syndrome: Secondary | ICD-10-CM | POA: Diagnosis not present

## 2018-09-28 DIAGNOSIS — K551 Chronic vascular disorders of intestine: Secondary | ICD-10-CM | POA: Diagnosis not present

## 2018-09-28 NOTE — Telephone Encounter (Signed)
Called left message to call back 

## 2018-09-29 DIAGNOSIS — Z7902 Long term (current) use of antithrombotics/antiplatelets: Secondary | ICD-10-CM | POA: Diagnosis not present

## 2018-09-29 DIAGNOSIS — Z90710 Acquired absence of both cervix and uterus: Secondary | ICD-10-CM | POA: Diagnosis not present

## 2018-09-29 DIAGNOSIS — K629 Disease of anus and rectum, unspecified: Secondary | ICD-10-CM | POA: Diagnosis not present

## 2018-09-29 DIAGNOSIS — Z96642 Presence of left artificial hip joint: Secondary | ICD-10-CM | POA: Diagnosis present

## 2018-09-29 DIAGNOSIS — Z87891 Personal history of nicotine dependence: Secondary | ICD-10-CM | POA: Diagnosis not present

## 2018-09-29 DIAGNOSIS — Z7401 Bed confinement status: Secondary | ICD-10-CM | POA: Diagnosis not present

## 2018-09-29 DIAGNOSIS — H543 Unqualified visual loss, both eyes: Secondary | ICD-10-CM | POA: Diagnosis not present

## 2018-09-29 DIAGNOSIS — R1084 Generalized abdominal pain: Secondary | ICD-10-CM | POA: Diagnosis not present

## 2018-09-29 DIAGNOSIS — J449 Chronic obstructive pulmonary disease, unspecified: Secondary | ICD-10-CM | POA: Diagnosis present

## 2018-09-29 DIAGNOSIS — I2510 Atherosclerotic heart disease of native coronary artery without angina pectoris: Secondary | ICD-10-CM | POA: Diagnosis present

## 2018-09-29 DIAGNOSIS — R112 Nausea with vomiting, unspecified: Secondary | ICD-10-CM | POA: Diagnosis not present

## 2018-09-29 DIAGNOSIS — K7689 Other specified diseases of liver: Secondary | ICD-10-CM | POA: Diagnosis not present

## 2018-09-29 DIAGNOSIS — R160 Hepatomegaly, not elsewhere classified: Secondary | ICD-10-CM | POA: Diagnosis not present

## 2018-09-29 DIAGNOSIS — E1151 Type 2 diabetes mellitus with diabetic peripheral angiopathy without gangrene: Secondary | ICD-10-CM | POA: Diagnosis not present

## 2018-09-29 DIAGNOSIS — E871 Hypo-osmolality and hyponatremia: Secondary | ICD-10-CM | POA: Diagnosis not present

## 2018-09-29 DIAGNOSIS — F329 Major depressive disorder, single episode, unspecified: Secondary | ICD-10-CM | POA: Diagnosis present

## 2018-09-29 DIAGNOSIS — Z8249 Family history of ischemic heart disease and other diseases of the circulatory system: Secondary | ICD-10-CM | POA: Diagnosis not present

## 2018-09-29 DIAGNOSIS — Z96652 Presence of left artificial knee joint: Secondary | ICD-10-CM | POA: Diagnosis present

## 2018-09-29 DIAGNOSIS — I252 Old myocardial infarction: Secondary | ICD-10-CM | POA: Diagnosis not present

## 2018-09-29 DIAGNOSIS — Z833 Family history of diabetes mellitus: Secondary | ICD-10-CM | POA: Diagnosis not present

## 2018-09-29 DIAGNOSIS — Z7984 Long term (current) use of oral hypoglycemic drugs: Secondary | ICD-10-CM | POA: Diagnosis not present

## 2018-09-29 DIAGNOSIS — N179 Acute kidney failure, unspecified: Secondary | ICD-10-CM | POA: Diagnosis not present

## 2018-09-29 DIAGNOSIS — E860 Dehydration: Secondary | ICD-10-CM | POA: Diagnosis not present

## 2018-09-29 DIAGNOSIS — G20 Parkinson's disease: Secondary | ICD-10-CM | POA: Diagnosis not present

## 2018-09-29 DIAGNOSIS — E1122 Type 2 diabetes mellitus with diabetic chronic kidney disease: Secondary | ICD-10-CM | POA: Diagnosis not present

## 2018-09-29 DIAGNOSIS — N3090 Cystitis, unspecified without hematuria: Secondary | ICD-10-CM | POA: Diagnosis present

## 2018-09-29 DIAGNOSIS — N189 Chronic kidney disease, unspecified: Secondary | ICD-10-CM | POA: Diagnosis not present

## 2018-09-29 DIAGNOSIS — H3530 Unspecified macular degeneration: Secondary | ICD-10-CM | POA: Diagnosis present

## 2018-09-29 DIAGNOSIS — F419 Anxiety disorder, unspecified: Secondary | ICD-10-CM | POA: Diagnosis present

## 2018-09-29 DIAGNOSIS — Z79899 Other long term (current) drug therapy: Secondary | ICD-10-CM | POA: Diagnosis not present

## 2018-09-29 DIAGNOSIS — K529 Noninfective gastroenteritis and colitis, unspecified: Secondary | ICD-10-CM | POA: Diagnosis not present

## 2018-09-29 DIAGNOSIS — G2581 Restless legs syndrome: Secondary | ICD-10-CM | POA: Diagnosis present

## 2018-09-29 DIAGNOSIS — E876 Hypokalemia: Secondary | ICD-10-CM | POA: Diagnosis not present

## 2018-09-29 DIAGNOSIS — E875 Hyperkalemia: Secondary | ICD-10-CM | POA: Diagnosis not present

## 2018-09-29 DIAGNOSIS — N3000 Acute cystitis without hematuria: Secondary | ICD-10-CM | POA: Diagnosis not present

## 2018-09-29 DIAGNOSIS — Z7982 Long term (current) use of aspirin: Secondary | ICD-10-CM | POA: Diagnosis not present

## 2018-09-29 DIAGNOSIS — Z809 Family history of malignant neoplasm, unspecified: Secondary | ICD-10-CM | POA: Diagnosis not present

## 2018-09-29 DIAGNOSIS — I739 Peripheral vascular disease, unspecified: Secondary | ICD-10-CM | POA: Diagnosis not present

## 2018-09-29 DIAGNOSIS — R197 Diarrhea, unspecified: Secondary | ICD-10-CM | POA: Diagnosis not present

## 2018-09-29 NOTE — Telephone Encounter (Signed)
Called left message to call back 

## 2018-10-02 DIAGNOSIS — R9431 Abnormal electrocardiogram [ECG] [EKG]: Secondary | ICD-10-CM | POA: Diagnosis not present

## 2018-10-06 DIAGNOSIS — D3502 Benign neoplasm of left adrenal gland: Secondary | ICD-10-CM | POA: Diagnosis not present

## 2018-10-06 DIAGNOSIS — I700 Atherosclerosis of aorta: Secondary | ICD-10-CM | POA: Diagnosis not present

## 2018-10-06 DIAGNOSIS — I714 Abdominal aortic aneurysm, without rupture: Secondary | ICD-10-CM | POA: Diagnosis not present

## 2018-10-06 DIAGNOSIS — K551 Chronic vascular disorders of intestine: Secondary | ICD-10-CM | POA: Diagnosis not present

## 2018-10-06 DIAGNOSIS — I739 Peripheral vascular disease, unspecified: Secondary | ICD-10-CM | POA: Diagnosis not present

## 2018-10-06 DIAGNOSIS — Z7982 Long term (current) use of aspirin: Secondary | ICD-10-CM | POA: Diagnosis not present

## 2018-10-06 DIAGNOSIS — K769 Liver disease, unspecified: Secondary | ICD-10-CM | POA: Diagnosis not present

## 2018-10-06 DIAGNOSIS — K7689 Other specified diseases of liver: Secondary | ICD-10-CM | POA: Diagnosis not present

## 2018-10-13 DIAGNOSIS — R160 Hepatomegaly, not elsewhere classified: Secondary | ICD-10-CM | POA: Diagnosis not present

## 2018-10-13 DIAGNOSIS — I739 Peripheral vascular disease, unspecified: Secondary | ICD-10-CM | POA: Diagnosis not present

## 2018-10-13 DIAGNOSIS — J449 Chronic obstructive pulmonary disease, unspecified: Secondary | ICD-10-CM | POA: Diagnosis not present

## 2018-10-13 DIAGNOSIS — E119 Type 2 diabetes mellitus without complications: Secondary | ICD-10-CM | POA: Diagnosis not present

## 2018-10-14 ENCOUNTER — Other Ambulatory Visit: Payer: Self-pay | Admitting: Family Medicine

## 2018-10-16 ENCOUNTER — Telehealth: Payer: Self-pay | Admitting: *Deleted

## 2018-10-16 NOTE — Telephone Encounter (Signed)
Received Lab Report results from [facility]; forwarded to provider/SLS 11/11

## 2018-10-25 ENCOUNTER — Telehealth: Payer: Self-pay

## 2018-10-25 NOTE — Telephone Encounter (Signed)
Phone call placed to patient to introduce Palliative Care and schedule a visit with NP. VM left with contact information

## 2018-10-25 NOTE — Telephone Encounter (Signed)
VM left to schedule visit with Palliative Care 

## 2018-10-26 ENCOUNTER — Telehealth: Payer: Self-pay

## 2018-10-26 NOTE — Telephone Encounter (Signed)
VM left for patient to schedule visit with Palliative care 

## 2018-10-26 NOTE — Telephone Encounter (Signed)
VM left to schedule visit with Palliative Care 

## 2018-10-26 NOTE — Telephone Encounter (Signed)
Returned call to Dawn Stewart with Dr. Liliane Channel office regarding status of referral. Message left for [facility]

## 2018-10-27 DIAGNOSIS — J449 Chronic obstructive pulmonary disease, unspecified: Secondary | ICD-10-CM | POA: Diagnosis not present

## 2018-10-27 DIAGNOSIS — Z87891 Personal history of nicotine dependence: Secondary | ICD-10-CM | POA: Diagnosis not present

## 2018-10-27 DIAGNOSIS — C772 Secondary and unspecified malignant neoplasm of intra-abdominal lymph nodes: Secondary | ICD-10-CM | POA: Diagnosis not present

## 2018-10-27 DIAGNOSIS — E1151 Type 2 diabetes mellitus with diabetic peripheral angiopathy without gangrene: Secondary | ICD-10-CM | POA: Diagnosis not present

## 2018-10-27 DIAGNOSIS — I10 Essential (primary) hypertension: Secondary | ICD-10-CM | POA: Diagnosis not present

## 2018-10-27 DIAGNOSIS — Z9981 Dependence on supplemental oxygen: Secondary | ICD-10-CM | POA: Diagnosis not present

## 2018-10-27 DIAGNOSIS — K551 Chronic vascular disorders of intestine: Secondary | ICD-10-CM | POA: Diagnosis not present

## 2018-10-27 DIAGNOSIS — I700 Atherosclerosis of aorta: Secondary | ICD-10-CM | POA: Diagnosis not present

## 2018-10-27 DIAGNOSIS — F419 Anxiety disorder, unspecified: Secondary | ICD-10-CM | POA: Diagnosis not present

## 2018-10-27 DIAGNOSIS — C220 Liver cell carcinoma: Secondary | ICD-10-CM | POA: Diagnosis not present

## 2018-10-27 DIAGNOSIS — I70213 Atherosclerosis of native arteries of extremities with intermittent claudication, bilateral legs: Secondary | ICD-10-CM | POA: Diagnosis not present

## 2018-10-27 DIAGNOSIS — I2510 Atherosclerotic heart disease of native coronary artery without angina pectoris: Secondary | ICD-10-CM | POA: Diagnosis not present

## 2018-10-27 DIAGNOSIS — H548 Legal blindness, as defined in USA: Secondary | ICD-10-CM | POA: Diagnosis not present

## 2018-10-27 DIAGNOSIS — H3530 Unspecified macular degeneration: Secondary | ICD-10-CM | POA: Diagnosis not present

## 2018-10-30 ENCOUNTER — Ambulatory Visit: Payer: Medicare Other | Admitting: Family Medicine

## 2018-10-30 ENCOUNTER — Encounter: Payer: Self-pay | Admitting: Family Medicine

## 2018-10-30 DIAGNOSIS — Z0289 Encounter for other administrative examinations: Secondary | ICD-10-CM

## 2018-10-31 DIAGNOSIS — I700 Atherosclerosis of aorta: Secondary | ICD-10-CM | POA: Diagnosis not present

## 2018-10-31 DIAGNOSIS — E1151 Type 2 diabetes mellitus with diabetic peripheral angiopathy without gangrene: Secondary | ICD-10-CM | POA: Diagnosis not present

## 2018-10-31 DIAGNOSIS — I70213 Atherosclerosis of native arteries of extremities with intermittent claudication, bilateral legs: Secondary | ICD-10-CM | POA: Diagnosis not present

## 2018-10-31 DIAGNOSIS — J449 Chronic obstructive pulmonary disease, unspecified: Secondary | ICD-10-CM | POA: Diagnosis not present

## 2018-10-31 DIAGNOSIS — C772 Secondary and unspecified malignant neoplasm of intra-abdominal lymph nodes: Secondary | ICD-10-CM | POA: Diagnosis not present

## 2018-10-31 DIAGNOSIS — C220 Liver cell carcinoma: Secondary | ICD-10-CM | POA: Diagnosis not present

## 2018-11-03 DIAGNOSIS — C772 Secondary and unspecified malignant neoplasm of intra-abdominal lymph nodes: Secondary | ICD-10-CM | POA: Diagnosis not present

## 2018-11-03 DIAGNOSIS — I700 Atherosclerosis of aorta: Secondary | ICD-10-CM | POA: Diagnosis not present

## 2018-11-03 DIAGNOSIS — E1151 Type 2 diabetes mellitus with diabetic peripheral angiopathy without gangrene: Secondary | ICD-10-CM | POA: Diagnosis not present

## 2018-11-03 DIAGNOSIS — I70213 Atherosclerosis of native arteries of extremities with intermittent claudication, bilateral legs: Secondary | ICD-10-CM | POA: Diagnosis not present

## 2018-11-03 DIAGNOSIS — C220 Liver cell carcinoma: Secondary | ICD-10-CM | POA: Diagnosis not present

## 2018-11-03 DIAGNOSIS — J449 Chronic obstructive pulmonary disease, unspecified: Secondary | ICD-10-CM | POA: Diagnosis not present

## 2018-11-05 DIAGNOSIS — E1165 Type 2 diabetes mellitus with hyperglycemia: Secondary | ICD-10-CM | POA: Diagnosis not present

## 2018-11-05 DIAGNOSIS — Z794 Long term (current) use of insulin: Secondary | ICD-10-CM | POA: Diagnosis not present

## 2018-11-05 DIAGNOSIS — K551 Chronic vascular disorders of intestine: Secondary | ICD-10-CM | POA: Diagnosis not present

## 2018-11-05 DIAGNOSIS — I2510 Atherosclerotic heart disease of native coronary artery without angina pectoris: Secondary | ICD-10-CM | POA: Diagnosis not present

## 2018-11-05 DIAGNOSIS — I70213 Atherosclerosis of native arteries of extremities with intermittent claudication, bilateral legs: Secondary | ICD-10-CM | POA: Diagnosis not present

## 2018-11-05 DIAGNOSIS — I700 Atherosclerosis of aorta: Secondary | ICD-10-CM | POA: Diagnosis not present

## 2018-11-05 DIAGNOSIS — R112 Nausea with vomiting, unspecified: Secondary | ICD-10-CM | POA: Diagnosis not present

## 2018-11-05 DIAGNOSIS — I10 Essential (primary) hypertension: Secondary | ICD-10-CM | POA: Diagnosis not present

## 2018-11-05 DIAGNOSIS — E1151 Type 2 diabetes mellitus with diabetic peripheral angiopathy without gangrene: Secondary | ICD-10-CM | POA: Diagnosis not present

## 2018-11-05 DIAGNOSIS — C772 Secondary and unspecified malignant neoplasm of intra-abdominal lymph nodes: Secondary | ICD-10-CM | POA: Diagnosis not present

## 2018-11-05 DIAGNOSIS — Z87891 Personal history of nicotine dependence: Secondary | ICD-10-CM | POA: Diagnosis not present

## 2018-11-05 DIAGNOSIS — H548 Legal blindness, as defined in USA: Secondary | ICD-10-CM | POA: Diagnosis not present

## 2018-11-05 DIAGNOSIS — H3530 Unspecified macular degeneration: Secondary | ICD-10-CM | POA: Diagnosis not present

## 2018-11-05 DIAGNOSIS — C220 Liver cell carcinoma: Secondary | ICD-10-CM | POA: Diagnosis not present

## 2018-11-05 DIAGNOSIS — Z9981 Dependence on supplemental oxygen: Secondary | ICD-10-CM | POA: Diagnosis not present

## 2018-11-05 DIAGNOSIS — R197 Diarrhea, unspecified: Secondary | ICD-10-CM | POA: Diagnosis not present

## 2018-11-05 DIAGNOSIS — J449 Chronic obstructive pulmonary disease, unspecified: Secondary | ICD-10-CM | POA: Diagnosis not present

## 2018-11-05 DIAGNOSIS — F419 Anxiety disorder, unspecified: Secondary | ICD-10-CM | POA: Diagnosis not present

## 2018-11-17 DIAGNOSIS — R1111 Vomiting without nausea: Secondary | ICD-10-CM | POA: Diagnosis not present

## 2018-11-17 DIAGNOSIS — R112 Nausea with vomiting, unspecified: Secondary | ICD-10-CM | POA: Diagnosis not present

## 2018-11-17 DIAGNOSIS — R197 Diarrhea, unspecified: Secondary | ICD-10-CM | POA: Diagnosis not present

## 2018-11-17 DIAGNOSIS — R5381 Other malaise: Secondary | ICD-10-CM | POA: Diagnosis not present

## 2018-11-17 DIAGNOSIS — R110 Nausea: Secondary | ICD-10-CM | POA: Diagnosis not present

## 2018-11-17 DIAGNOSIS — I10 Essential (primary) hypertension: Secondary | ICD-10-CM | POA: Diagnosis not present

## 2018-11-17 DIAGNOSIS — E1165 Type 2 diabetes mellitus with hyperglycemia: Secondary | ICD-10-CM | POA: Diagnosis not present

## 2018-11-17 DIAGNOSIS — Z794 Long term (current) use of insulin: Secondary | ICD-10-CM | POA: Diagnosis not present

## 2018-12-06 DIAGNOSIS — D3502 Benign neoplasm of left adrenal gland: Secondary | ICD-10-CM | POA: Diagnosis not present

## 2018-12-06 DIAGNOSIS — I10 Essential (primary) hypertension: Secondary | ICD-10-CM | POA: Diagnosis not present

## 2018-12-06 DIAGNOSIS — Z87891 Personal history of nicotine dependence: Secondary | ICD-10-CM | POA: Diagnosis not present

## 2018-12-06 DIAGNOSIS — I70213 Atherosclerosis of native arteries of extremities with intermittent claudication, bilateral legs: Secondary | ICD-10-CM | POA: Diagnosis not present

## 2018-12-06 DIAGNOSIS — E1151 Type 2 diabetes mellitus with diabetic peripheral angiopathy without gangrene: Secondary | ICD-10-CM | POA: Diagnosis not present

## 2018-12-06 DIAGNOSIS — H3530 Unspecified macular degeneration: Secondary | ICD-10-CM | POA: Diagnosis not present

## 2018-12-06 DIAGNOSIS — C220 Liver cell carcinoma: Secondary | ICD-10-CM | POA: Diagnosis not present

## 2018-12-06 DIAGNOSIS — K551 Chronic vascular disorders of intestine: Secondary | ICD-10-CM | POA: Diagnosis not present

## 2018-12-06 DIAGNOSIS — F419 Anxiety disorder, unspecified: Secondary | ICD-10-CM | POA: Diagnosis not present

## 2018-12-06 DIAGNOSIS — I2510 Atherosclerotic heart disease of native coronary artery without angina pectoris: Secondary | ICD-10-CM | POA: Diagnosis not present

## 2018-12-06 DIAGNOSIS — H548 Legal blindness, as defined in USA: Secondary | ICD-10-CM | POA: Diagnosis not present

## 2018-12-06 DIAGNOSIS — I700 Atherosclerosis of aorta: Secondary | ICD-10-CM | POA: Diagnosis not present

## 2018-12-06 DIAGNOSIS — Z9981 Dependence on supplemental oxygen: Secondary | ICD-10-CM | POA: Diagnosis not present

## 2018-12-06 DIAGNOSIS — J449 Chronic obstructive pulmonary disease, unspecified: Secondary | ICD-10-CM | POA: Diagnosis not present

## 2018-12-12 DIAGNOSIS — C220 Liver cell carcinoma: Secondary | ICD-10-CM | POA: Diagnosis not present

## 2018-12-12 DIAGNOSIS — I70213 Atherosclerosis of native arteries of extremities with intermittent claudication, bilateral legs: Secondary | ICD-10-CM | POA: Diagnosis not present

## 2018-12-12 DIAGNOSIS — E1151 Type 2 diabetes mellitus with diabetic peripheral angiopathy without gangrene: Secondary | ICD-10-CM | POA: Diagnosis not present

## 2018-12-12 DIAGNOSIS — I700 Atherosclerosis of aorta: Secondary | ICD-10-CM | POA: Diagnosis not present

## 2018-12-12 DIAGNOSIS — J449 Chronic obstructive pulmonary disease, unspecified: Secondary | ICD-10-CM | POA: Diagnosis not present

## 2018-12-12 DIAGNOSIS — D3502 Benign neoplasm of left adrenal gland: Secondary | ICD-10-CM | POA: Diagnosis not present

## 2018-12-20 DIAGNOSIS — E1151 Type 2 diabetes mellitus with diabetic peripheral angiopathy without gangrene: Secondary | ICD-10-CM | POA: Diagnosis not present

## 2018-12-20 DIAGNOSIS — I700 Atherosclerosis of aorta: Secondary | ICD-10-CM | POA: Diagnosis not present

## 2018-12-20 DIAGNOSIS — I70213 Atherosclerosis of native arteries of extremities with intermittent claudication, bilateral legs: Secondary | ICD-10-CM | POA: Diagnosis not present

## 2018-12-20 DIAGNOSIS — C220 Liver cell carcinoma: Secondary | ICD-10-CM | POA: Diagnosis not present

## 2018-12-20 DIAGNOSIS — D3502 Benign neoplasm of left adrenal gland: Secondary | ICD-10-CM | POA: Diagnosis not present

## 2018-12-20 DIAGNOSIS — J449 Chronic obstructive pulmonary disease, unspecified: Secondary | ICD-10-CM | POA: Diagnosis not present

## 2018-12-21 DIAGNOSIS — I700 Atherosclerosis of aorta: Secondary | ICD-10-CM | POA: Diagnosis not present

## 2018-12-21 DIAGNOSIS — C220 Liver cell carcinoma: Secondary | ICD-10-CM | POA: Diagnosis not present

## 2018-12-21 DIAGNOSIS — I70213 Atherosclerosis of native arteries of extremities with intermittent claudication, bilateral legs: Secondary | ICD-10-CM | POA: Diagnosis not present

## 2018-12-21 DIAGNOSIS — J449 Chronic obstructive pulmonary disease, unspecified: Secondary | ICD-10-CM | POA: Diagnosis not present

## 2018-12-21 DIAGNOSIS — D3502 Benign neoplasm of left adrenal gland: Secondary | ICD-10-CM | POA: Diagnosis not present

## 2018-12-21 DIAGNOSIS — E1151 Type 2 diabetes mellitus with diabetic peripheral angiopathy without gangrene: Secondary | ICD-10-CM | POA: Diagnosis not present

## 2018-12-28 DIAGNOSIS — I700 Atherosclerosis of aorta: Secondary | ICD-10-CM | POA: Diagnosis not present

## 2018-12-28 DIAGNOSIS — D3502 Benign neoplasm of left adrenal gland: Secondary | ICD-10-CM | POA: Diagnosis not present

## 2018-12-28 DIAGNOSIS — J449 Chronic obstructive pulmonary disease, unspecified: Secondary | ICD-10-CM | POA: Diagnosis not present

## 2018-12-28 DIAGNOSIS — C220 Liver cell carcinoma: Secondary | ICD-10-CM | POA: Diagnosis not present

## 2018-12-28 DIAGNOSIS — E1151 Type 2 diabetes mellitus with diabetic peripheral angiopathy without gangrene: Secondary | ICD-10-CM | POA: Diagnosis not present

## 2018-12-28 DIAGNOSIS — I70213 Atherosclerosis of native arteries of extremities with intermittent claudication, bilateral legs: Secondary | ICD-10-CM | POA: Diagnosis not present

## 2019-01-01 DIAGNOSIS — C220 Liver cell carcinoma: Secondary | ICD-10-CM | POA: Diagnosis not present

## 2019-01-01 DIAGNOSIS — I70213 Atherosclerosis of native arteries of extremities with intermittent claudication, bilateral legs: Secondary | ICD-10-CM | POA: Diagnosis not present

## 2019-01-01 DIAGNOSIS — E1151 Type 2 diabetes mellitus with diabetic peripheral angiopathy without gangrene: Secondary | ICD-10-CM | POA: Diagnosis not present

## 2019-01-01 DIAGNOSIS — I700 Atherosclerosis of aorta: Secondary | ICD-10-CM | POA: Diagnosis not present

## 2019-01-01 DIAGNOSIS — J449 Chronic obstructive pulmonary disease, unspecified: Secondary | ICD-10-CM | POA: Diagnosis not present

## 2019-01-01 DIAGNOSIS — D3502 Benign neoplasm of left adrenal gland: Secondary | ICD-10-CM | POA: Diagnosis not present

## 2019-01-04 DIAGNOSIS — C220 Liver cell carcinoma: Secondary | ICD-10-CM | POA: Diagnosis not present

## 2019-01-04 DIAGNOSIS — I70213 Atherosclerosis of native arteries of extremities with intermittent claudication, bilateral legs: Secondary | ICD-10-CM | POA: Diagnosis not present

## 2019-01-04 DIAGNOSIS — D3502 Benign neoplasm of left adrenal gland: Secondary | ICD-10-CM | POA: Diagnosis not present

## 2019-01-04 DIAGNOSIS — J449 Chronic obstructive pulmonary disease, unspecified: Secondary | ICD-10-CM | POA: Diagnosis not present

## 2019-01-04 DIAGNOSIS — I700 Atherosclerosis of aorta: Secondary | ICD-10-CM | POA: Diagnosis not present

## 2019-01-04 DIAGNOSIS — E1151 Type 2 diabetes mellitus with diabetic peripheral angiopathy without gangrene: Secondary | ICD-10-CM | POA: Diagnosis not present

## 2019-01-06 DIAGNOSIS — H548 Legal blindness, as defined in USA: Secondary | ICD-10-CM | POA: Diagnosis not present

## 2019-01-06 DIAGNOSIS — I700 Atherosclerosis of aorta: Secondary | ICD-10-CM | POA: Diagnosis not present

## 2019-01-06 DIAGNOSIS — I70213 Atherosclerosis of native arteries of extremities with intermittent claudication, bilateral legs: Secondary | ICD-10-CM | POA: Diagnosis not present

## 2019-01-06 DIAGNOSIS — H3530 Unspecified macular degeneration: Secondary | ICD-10-CM | POA: Diagnosis not present

## 2019-01-06 DIAGNOSIS — I2510 Atherosclerotic heart disease of native coronary artery without angina pectoris: Secondary | ICD-10-CM | POA: Diagnosis not present

## 2019-01-06 DIAGNOSIS — C220 Liver cell carcinoma: Secondary | ICD-10-CM | POA: Diagnosis not present

## 2019-01-06 DIAGNOSIS — F419 Anxiety disorder, unspecified: Secondary | ICD-10-CM | POA: Diagnosis not present

## 2019-01-06 DIAGNOSIS — K551 Chronic vascular disorders of intestine: Secondary | ICD-10-CM | POA: Diagnosis not present

## 2019-01-06 DIAGNOSIS — J449 Chronic obstructive pulmonary disease, unspecified: Secondary | ICD-10-CM | POA: Diagnosis not present

## 2019-01-06 DIAGNOSIS — Z87891 Personal history of nicotine dependence: Secondary | ICD-10-CM | POA: Diagnosis not present

## 2019-01-06 DIAGNOSIS — E1151 Type 2 diabetes mellitus with diabetic peripheral angiopathy without gangrene: Secondary | ICD-10-CM | POA: Diagnosis not present

## 2019-01-06 DIAGNOSIS — Z9981 Dependence on supplemental oxygen: Secondary | ICD-10-CM | POA: Diagnosis not present

## 2019-01-06 DIAGNOSIS — I10 Essential (primary) hypertension: Secondary | ICD-10-CM | POA: Diagnosis not present

## 2019-01-06 DIAGNOSIS — D3502 Benign neoplasm of left adrenal gland: Secondary | ICD-10-CM | POA: Diagnosis not present

## 2019-01-07 DIAGNOSIS — I70213 Atherosclerosis of native arteries of extremities with intermittent claudication, bilateral legs: Secondary | ICD-10-CM | POA: Diagnosis not present

## 2019-01-07 DIAGNOSIS — D3502 Benign neoplasm of left adrenal gland: Secondary | ICD-10-CM | POA: Diagnosis not present

## 2019-01-07 DIAGNOSIS — I700 Atherosclerosis of aorta: Secondary | ICD-10-CM | POA: Diagnosis not present

## 2019-01-07 DIAGNOSIS — E1151 Type 2 diabetes mellitus with diabetic peripheral angiopathy without gangrene: Secondary | ICD-10-CM | POA: Diagnosis not present

## 2019-01-07 DIAGNOSIS — J449 Chronic obstructive pulmonary disease, unspecified: Secondary | ICD-10-CM | POA: Diagnosis not present

## 2019-01-07 DIAGNOSIS — C220 Liver cell carcinoma: Secondary | ICD-10-CM | POA: Diagnosis not present

## 2019-01-12 DIAGNOSIS — C220 Liver cell carcinoma: Secondary | ICD-10-CM | POA: Diagnosis not present

## 2019-01-12 DIAGNOSIS — J449 Chronic obstructive pulmonary disease, unspecified: Secondary | ICD-10-CM | POA: Diagnosis not present

## 2019-01-12 DIAGNOSIS — E1151 Type 2 diabetes mellitus with diabetic peripheral angiopathy without gangrene: Secondary | ICD-10-CM | POA: Diagnosis not present

## 2019-01-12 DIAGNOSIS — D3502 Benign neoplasm of left adrenal gland: Secondary | ICD-10-CM | POA: Diagnosis not present

## 2019-01-12 DIAGNOSIS — I70213 Atherosclerosis of native arteries of extremities with intermittent claudication, bilateral legs: Secondary | ICD-10-CM | POA: Diagnosis not present

## 2019-01-12 DIAGNOSIS — I700 Atherosclerosis of aorta: Secondary | ICD-10-CM | POA: Diagnosis not present

## 2019-01-18 DIAGNOSIS — E1151 Type 2 diabetes mellitus with diabetic peripheral angiopathy without gangrene: Secondary | ICD-10-CM | POA: Diagnosis not present

## 2019-01-18 DIAGNOSIS — J449 Chronic obstructive pulmonary disease, unspecified: Secondary | ICD-10-CM | POA: Diagnosis not present

## 2019-01-18 DIAGNOSIS — I700 Atherosclerosis of aorta: Secondary | ICD-10-CM | POA: Diagnosis not present

## 2019-01-18 DIAGNOSIS — I70213 Atherosclerosis of native arteries of extremities with intermittent claudication, bilateral legs: Secondary | ICD-10-CM | POA: Diagnosis not present

## 2019-01-18 DIAGNOSIS — D3502 Benign neoplasm of left adrenal gland: Secondary | ICD-10-CM | POA: Diagnosis not present

## 2019-01-18 DIAGNOSIS — C220 Liver cell carcinoma: Secondary | ICD-10-CM | POA: Diagnosis not present

## 2019-02-04 DEATH — deceased

## 2019-03-02 ENCOUNTER — Telehealth: Payer: Self-pay | Admitting: Family Medicine

## 2019-03-02 NOTE — Telephone Encounter (Signed)
Called the patients daughter to change her visit type for 03/05/19 and daughter stated her mom passed away on January 29, 2019. Updated chart

## 2019-03-05 ENCOUNTER — Ambulatory Visit: Payer: Self-pay | Admitting: Family Medicine
